# Patient Record
Sex: Female | Born: 1935
Health system: Southern US, Community
[De-identification: ages and names within clinical notes are randomized; demographics above are authoritative.]

## PROBLEM LIST (undated history)

## (undated) DIAGNOSIS — I10 Essential (primary) hypertension: Secondary | ICD-10-CM

## (undated) DIAGNOSIS — I499 Cardiac arrhythmia, unspecified: Secondary | ICD-10-CM

## (undated) DIAGNOSIS — Z9889 Other specified postprocedural states: Secondary | ICD-10-CM

## (undated) DIAGNOSIS — R112 Nausea with vomiting, unspecified: Secondary | ICD-10-CM

## (undated) HISTORY — PX: EYE SURGERY: SHX253

## (undated) HISTORY — PX: ABDOMINAL HYSTERECTOMY: SHX81

## (undated) HISTORY — PX: TONSILLECTOMY: SUR1361

---

## 2017-01-05 DIAGNOSIS — H401131 Primary open-angle glaucoma, bilateral, mild stage: Secondary | ICD-10-CM | POA: Diagnosis not present

## 2017-02-04 HISTORY — PX: SHOULDER SURGERY: SHX246

## 2017-02-05 DIAGNOSIS — T148XXA Other injury of unspecified body region, initial encounter: Secondary | ICD-10-CM | POA: Diagnosis not present

## 2017-02-05 DIAGNOSIS — S42292A Other displaced fracture of upper end of left humerus, initial encounter for closed fracture: Secondary | ICD-10-CM | POA: Diagnosis not present

## 2017-02-05 DIAGNOSIS — X58XXXA Exposure to other specified factors, initial encounter: Secondary | ICD-10-CM | POA: Diagnosis not present

## 2017-02-05 DIAGNOSIS — S42202A Unspecified fracture of upper end of left humerus, initial encounter for closed fracture: Secondary | ICD-10-CM | POA: Diagnosis not present

## 2017-02-05 DIAGNOSIS — W19XXXA Unspecified fall, initial encounter: Secondary | ICD-10-CM | POA: Diagnosis not present

## 2017-02-09 DIAGNOSIS — H401131 Primary open-angle glaucoma, bilateral, mild stage: Secondary | ICD-10-CM | POA: Diagnosis not present

## 2017-02-10 DIAGNOSIS — S42222A 2-part displaced fracture of surgical neck of left humerus, initial encounter for closed fracture: Secondary | ICD-10-CM | POA: Diagnosis not present

## 2017-02-11 DIAGNOSIS — S42222A 2-part displaced fracture of surgical neck of left humerus, initial encounter for closed fracture: Secondary | ICD-10-CM | POA: Diagnosis not present

## 2017-02-11 DIAGNOSIS — Z01812 Encounter for preprocedural laboratory examination: Secondary | ICD-10-CM | POA: Diagnosis not present

## 2017-02-12 DIAGNOSIS — Z01818 Encounter for other preprocedural examination: Secondary | ICD-10-CM | POA: Diagnosis not present

## 2017-02-12 DIAGNOSIS — S42222A 2-part displaced fracture of surgical neck of left humerus, initial encounter for closed fracture: Secondary | ICD-10-CM | POA: Diagnosis not present

## 2017-02-16 DIAGNOSIS — S42201D Unspecified fracture of upper end of right humerus, subsequent encounter for fracture with routine healing: Secondary | ICD-10-CM | POA: Diagnosis not present

## 2017-02-16 DIAGNOSIS — E785 Hyperlipidemia, unspecified: Secondary | ICD-10-CM | POA: Diagnosis present

## 2017-02-16 DIAGNOSIS — Z4789 Encounter for other orthopedic aftercare: Secondary | ICD-10-CM | POA: Diagnosis not present

## 2017-02-16 DIAGNOSIS — S42292D Other displaced fracture of upper end of left humerus, subsequent encounter for fracture with routine healing: Secondary | ICD-10-CM | POA: Diagnosis not present

## 2017-02-16 DIAGNOSIS — I10 Essential (primary) hypertension: Secondary | ICD-10-CM | POA: Diagnosis not present

## 2017-02-16 DIAGNOSIS — R278 Other lack of coordination: Secondary | ICD-10-CM | POA: Diagnosis not present

## 2017-02-16 DIAGNOSIS — R42 Dizziness and giddiness: Secondary | ICD-10-CM | POA: Diagnosis present

## 2017-02-16 DIAGNOSIS — M6281 Muscle weakness (generalized): Secondary | ICD-10-CM | POA: Diagnosis not present

## 2017-02-16 DIAGNOSIS — K5900 Constipation, unspecified: Secondary | ICD-10-CM | POA: Diagnosis not present

## 2017-02-16 DIAGNOSIS — Z4889 Encounter for other specified surgical aftercare: Secondary | ICD-10-CM | POA: Diagnosis not present

## 2017-02-16 DIAGNOSIS — I129 Hypertensive chronic kidney disease with stage 1 through stage 4 chronic kidney disease, or unspecified chronic kidney disease: Secondary | ICD-10-CM | POA: Diagnosis present

## 2017-02-16 DIAGNOSIS — E039 Hypothyroidism, unspecified: Secondary | ICD-10-CM | POA: Diagnosis present

## 2017-02-16 DIAGNOSIS — N183 Chronic kidney disease, stage 3 (moderate): Secondary | ICD-10-CM | POA: Diagnosis present

## 2017-02-16 DIAGNOSIS — S42222A 2-part displaced fracture of surgical neck of left humerus, initial encounter for closed fracture: Secondary | ICD-10-CM | POA: Diagnosis not present

## 2017-02-16 DIAGNOSIS — M25512 Pain in left shoulder: Secondary | ICD-10-CM | POA: Diagnosis not present

## 2017-02-16 DIAGNOSIS — S42292A Other displaced fracture of upper end of left humerus, initial encounter for closed fracture: Secondary | ICD-10-CM | POA: Diagnosis present

## 2017-02-16 DIAGNOSIS — H409 Unspecified glaucoma: Secondary | ICD-10-CM | POA: Diagnosis not present

## 2017-02-16 DIAGNOSIS — R262 Difficulty in walking, not elsewhere classified: Secondary | ICD-10-CM | POA: Diagnosis not present

## 2017-02-16 DIAGNOSIS — S42202D Unspecified fracture of upper end of left humerus, subsequent encounter for fracture with routine healing: Secondary | ICD-10-CM | POA: Diagnosis not present

## 2017-02-16 DIAGNOSIS — R2681 Unsteadiness on feet: Secondary | ICD-10-CM | POA: Diagnosis not present

## 2017-02-19 DIAGNOSIS — H409 Unspecified glaucoma: Secondary | ICD-10-CM | POA: Diagnosis not present

## 2017-02-19 DIAGNOSIS — E039 Hypothyroidism, unspecified: Secondary | ICD-10-CM | POA: Diagnosis not present

## 2017-02-19 DIAGNOSIS — S42292D Other displaced fracture of upper end of left humerus, subsequent encounter for fracture with routine healing: Secondary | ICD-10-CM | POA: Diagnosis not present

## 2017-02-19 DIAGNOSIS — R262 Difficulty in walking, not elsewhere classified: Secondary | ICD-10-CM | POA: Diagnosis not present

## 2017-02-19 DIAGNOSIS — Z4889 Encounter for other specified surgical aftercare: Secondary | ICD-10-CM | POA: Diagnosis not present

## 2017-02-19 DIAGNOSIS — M6281 Muscle weakness (generalized): Secondary | ICD-10-CM | POA: Diagnosis not present

## 2017-02-19 DIAGNOSIS — S42202D Unspecified fracture of upper end of left humerus, subsequent encounter for fracture with routine healing: Secondary | ICD-10-CM | POA: Diagnosis not present

## 2017-02-19 DIAGNOSIS — S42201D Unspecified fracture of upper end of right humerus, subsequent encounter for fracture with routine healing: Secondary | ICD-10-CM | POA: Diagnosis not present

## 2017-02-19 DIAGNOSIS — R2681 Unsteadiness on feet: Secondary | ICD-10-CM | POA: Diagnosis not present

## 2017-02-19 DIAGNOSIS — R278 Other lack of coordination: Secondary | ICD-10-CM | POA: Diagnosis not present

## 2017-02-19 DIAGNOSIS — Z4789 Encounter for other orthopedic aftercare: Secondary | ICD-10-CM | POA: Diagnosis not present

## 2017-02-19 DIAGNOSIS — I10 Essential (primary) hypertension: Secondary | ICD-10-CM | POA: Diagnosis not present

## 2017-03-03 DIAGNOSIS — S42292D Other displaced fracture of upper end of left humerus, subsequent encounter for fracture with routine healing: Secondary | ICD-10-CM | POA: Diagnosis not present

## 2017-03-03 DIAGNOSIS — H409 Unspecified glaucoma: Secondary | ICD-10-CM | POA: Diagnosis not present

## 2017-03-03 DIAGNOSIS — I10 Essential (primary) hypertension: Secondary | ICD-10-CM | POA: Diagnosis not present

## 2017-03-03 DIAGNOSIS — E039 Hypothyroidism, unspecified: Secondary | ICD-10-CM | POA: Diagnosis not present

## 2017-03-06 DIAGNOSIS — S42202D Unspecified fracture of upper end of left humerus, subsequent encounter for fracture with routine healing: Secondary | ICD-10-CM | POA: Diagnosis not present

## 2017-03-06 DIAGNOSIS — M5136 Other intervertebral disc degeneration, lumbar region: Secondary | ICD-10-CM | POA: Diagnosis not present

## 2017-03-06 DIAGNOSIS — Z7982 Long term (current) use of aspirin: Secondary | ICD-10-CM | POA: Diagnosis not present

## 2017-03-06 DIAGNOSIS — K5900 Constipation, unspecified: Secondary | ICD-10-CM | POA: Diagnosis not present

## 2017-03-06 DIAGNOSIS — Z9181 History of falling: Secondary | ICD-10-CM | POA: Diagnosis not present

## 2017-03-06 DIAGNOSIS — I10 Essential (primary) hypertension: Secondary | ICD-10-CM | POA: Diagnosis not present

## 2017-03-06 DIAGNOSIS — H409 Unspecified glaucoma: Secondary | ICD-10-CM | POA: Diagnosis not present

## 2017-03-06 DIAGNOSIS — E039 Hypothyroidism, unspecified: Secondary | ICD-10-CM | POA: Diagnosis not present

## 2017-03-06 DIAGNOSIS — E538 Deficiency of other specified B group vitamins: Secondary | ICD-10-CM | POA: Diagnosis not present

## 2017-03-10 DIAGNOSIS — S42202D Unspecified fracture of upper end of left humerus, subsequent encounter for fracture with routine healing: Secondary | ICD-10-CM | POA: Diagnosis not present

## 2017-03-10 DIAGNOSIS — K5900 Constipation, unspecified: Secondary | ICD-10-CM | POA: Diagnosis not present

## 2017-03-10 DIAGNOSIS — H409 Unspecified glaucoma: Secondary | ICD-10-CM | POA: Diagnosis not present

## 2017-03-10 DIAGNOSIS — I10 Essential (primary) hypertension: Secondary | ICD-10-CM | POA: Diagnosis not present

## 2017-03-10 DIAGNOSIS — E039 Hypothyroidism, unspecified: Secondary | ICD-10-CM | POA: Diagnosis not present

## 2017-03-10 DIAGNOSIS — M5136 Other intervertebral disc degeneration, lumbar region: Secondary | ICD-10-CM | POA: Diagnosis not present

## 2017-03-11 DIAGNOSIS — I10 Essential (primary) hypertension: Secondary | ICD-10-CM | POA: Diagnosis not present

## 2017-03-11 DIAGNOSIS — S42202D Unspecified fracture of upper end of left humerus, subsequent encounter for fracture with routine healing: Secondary | ICD-10-CM | POA: Diagnosis not present

## 2017-03-11 DIAGNOSIS — E039 Hypothyroidism, unspecified: Secondary | ICD-10-CM | POA: Diagnosis not present

## 2017-03-11 DIAGNOSIS — K5900 Constipation, unspecified: Secondary | ICD-10-CM | POA: Diagnosis not present

## 2017-03-11 DIAGNOSIS — H409 Unspecified glaucoma: Secondary | ICD-10-CM | POA: Diagnosis not present

## 2017-03-11 DIAGNOSIS — M5136 Other intervertebral disc degeneration, lumbar region: Secondary | ICD-10-CM | POA: Diagnosis not present

## 2017-03-15 DIAGNOSIS — M5136 Other intervertebral disc degeneration, lumbar region: Secondary | ICD-10-CM | POA: Diagnosis not present

## 2017-03-15 DIAGNOSIS — H409 Unspecified glaucoma: Secondary | ICD-10-CM | POA: Diagnosis not present

## 2017-03-15 DIAGNOSIS — E039 Hypothyroidism, unspecified: Secondary | ICD-10-CM | POA: Diagnosis not present

## 2017-03-15 DIAGNOSIS — I10 Essential (primary) hypertension: Secondary | ICD-10-CM | POA: Diagnosis not present

## 2017-03-15 DIAGNOSIS — S42202D Unspecified fracture of upper end of left humerus, subsequent encounter for fracture with routine healing: Secondary | ICD-10-CM | POA: Diagnosis not present

## 2017-03-15 DIAGNOSIS — K5900 Constipation, unspecified: Secondary | ICD-10-CM | POA: Diagnosis not present

## 2017-03-17 DIAGNOSIS — K5900 Constipation, unspecified: Secondary | ICD-10-CM | POA: Diagnosis not present

## 2017-03-17 DIAGNOSIS — E039 Hypothyroidism, unspecified: Secondary | ICD-10-CM | POA: Diagnosis not present

## 2017-03-17 DIAGNOSIS — S42202D Unspecified fracture of upper end of left humerus, subsequent encounter for fracture with routine healing: Secondary | ICD-10-CM | POA: Diagnosis not present

## 2017-03-17 DIAGNOSIS — I10 Essential (primary) hypertension: Secondary | ICD-10-CM | POA: Diagnosis not present

## 2017-03-17 DIAGNOSIS — H409 Unspecified glaucoma: Secondary | ICD-10-CM | POA: Diagnosis not present

## 2017-03-17 DIAGNOSIS — M5136 Other intervertebral disc degeneration, lumbar region: Secondary | ICD-10-CM | POA: Diagnosis not present

## 2017-03-18 DIAGNOSIS — H409 Unspecified glaucoma: Secondary | ICD-10-CM | POA: Diagnosis not present

## 2017-03-18 DIAGNOSIS — K5900 Constipation, unspecified: Secondary | ICD-10-CM | POA: Diagnosis not present

## 2017-03-18 DIAGNOSIS — I10 Essential (primary) hypertension: Secondary | ICD-10-CM | POA: Diagnosis not present

## 2017-03-18 DIAGNOSIS — M5136 Other intervertebral disc degeneration, lumbar region: Secondary | ICD-10-CM | POA: Diagnosis not present

## 2017-03-18 DIAGNOSIS — S42202D Unspecified fracture of upper end of left humerus, subsequent encounter for fracture with routine healing: Secondary | ICD-10-CM | POA: Diagnosis not present

## 2017-03-18 DIAGNOSIS — E039 Hypothyroidism, unspecified: Secondary | ICD-10-CM | POA: Diagnosis not present

## 2017-03-20 DIAGNOSIS — M5136 Other intervertebral disc degeneration, lumbar region: Secondary | ICD-10-CM | POA: Diagnosis not present

## 2017-03-20 DIAGNOSIS — K5900 Constipation, unspecified: Secondary | ICD-10-CM | POA: Diagnosis not present

## 2017-03-20 DIAGNOSIS — H409 Unspecified glaucoma: Secondary | ICD-10-CM | POA: Diagnosis not present

## 2017-03-20 DIAGNOSIS — S42202D Unspecified fracture of upper end of left humerus, subsequent encounter for fracture with routine healing: Secondary | ICD-10-CM | POA: Diagnosis not present

## 2017-03-20 DIAGNOSIS — I10 Essential (primary) hypertension: Secondary | ICD-10-CM | POA: Diagnosis not present

## 2017-03-20 DIAGNOSIS — E039 Hypothyroidism, unspecified: Secondary | ICD-10-CM | POA: Diagnosis not present

## 2017-03-22 DIAGNOSIS — K5900 Constipation, unspecified: Secondary | ICD-10-CM | POA: Diagnosis not present

## 2017-03-22 DIAGNOSIS — H409 Unspecified glaucoma: Secondary | ICD-10-CM | POA: Diagnosis not present

## 2017-03-22 DIAGNOSIS — I10 Essential (primary) hypertension: Secondary | ICD-10-CM | POA: Diagnosis not present

## 2017-03-22 DIAGNOSIS — S42202D Unspecified fracture of upper end of left humerus, subsequent encounter for fracture with routine healing: Secondary | ICD-10-CM | POA: Diagnosis not present

## 2017-03-22 DIAGNOSIS — E039 Hypothyroidism, unspecified: Secondary | ICD-10-CM | POA: Diagnosis not present

## 2017-03-22 DIAGNOSIS — M5136 Other intervertebral disc degeneration, lumbar region: Secondary | ICD-10-CM | POA: Diagnosis not present

## 2017-03-23 DIAGNOSIS — I10 Essential (primary) hypertension: Secondary | ICD-10-CM | POA: Diagnosis not present

## 2017-03-23 DIAGNOSIS — S42202D Unspecified fracture of upper end of left humerus, subsequent encounter for fracture with routine healing: Secondary | ICD-10-CM | POA: Diagnosis not present

## 2017-03-23 DIAGNOSIS — H409 Unspecified glaucoma: Secondary | ICD-10-CM | POA: Diagnosis not present

## 2017-03-23 DIAGNOSIS — E039 Hypothyroidism, unspecified: Secondary | ICD-10-CM | POA: Diagnosis not present

## 2017-03-23 DIAGNOSIS — M5136 Other intervertebral disc degeneration, lumbar region: Secondary | ICD-10-CM | POA: Diagnosis not present

## 2017-03-23 DIAGNOSIS — K5900 Constipation, unspecified: Secondary | ICD-10-CM | POA: Diagnosis not present

## 2017-03-24 DIAGNOSIS — H409 Unspecified glaucoma: Secondary | ICD-10-CM | POA: Diagnosis not present

## 2017-03-24 DIAGNOSIS — S42222D 2-part displaced fracture of surgical neck of left humerus, subsequent encounter for fracture with routine healing: Secondary | ICD-10-CM | POA: Diagnosis not present

## 2017-03-24 DIAGNOSIS — M5136 Other intervertebral disc degeneration, lumbar region: Secondary | ICD-10-CM | POA: Diagnosis not present

## 2017-03-24 DIAGNOSIS — I10 Essential (primary) hypertension: Secondary | ICD-10-CM | POA: Diagnosis not present

## 2017-03-24 DIAGNOSIS — S42202D Unspecified fracture of upper end of left humerus, subsequent encounter for fracture with routine healing: Secondary | ICD-10-CM | POA: Diagnosis not present

## 2017-03-24 DIAGNOSIS — E039 Hypothyroidism, unspecified: Secondary | ICD-10-CM | POA: Diagnosis not present

## 2017-03-24 DIAGNOSIS — K5900 Constipation, unspecified: Secondary | ICD-10-CM | POA: Diagnosis not present

## 2017-03-29 DIAGNOSIS — S42222A 2-part displaced fracture of surgical neck of left humerus, initial encounter for closed fracture: Secondary | ICD-10-CM | POA: Diagnosis not present

## 2017-04-01 DIAGNOSIS — S42222A 2-part displaced fracture of surgical neck of left humerus, initial encounter for closed fracture: Secondary | ICD-10-CM | POA: Diagnosis not present

## 2017-04-05 DIAGNOSIS — S42222A 2-part displaced fracture of surgical neck of left humerus, initial encounter for closed fracture: Secondary | ICD-10-CM | POA: Diagnosis not present

## 2017-04-08 DIAGNOSIS — M79622 Pain in left upper arm: Secondary | ICD-10-CM | POA: Diagnosis not present

## 2017-04-13 DIAGNOSIS — M79622 Pain in left upper arm: Secondary | ICD-10-CM | POA: Diagnosis not present

## 2017-04-14 DIAGNOSIS — I10 Essential (primary) hypertension: Secondary | ICD-10-CM | POA: Diagnosis not present

## 2017-04-14 DIAGNOSIS — R42 Dizziness and giddiness: Secondary | ICD-10-CM | POA: Diagnosis not present

## 2017-04-15 DIAGNOSIS — M79622 Pain in left upper arm: Secondary | ICD-10-CM | POA: Diagnosis not present

## 2017-04-19 DIAGNOSIS — M79622 Pain in left upper arm: Secondary | ICD-10-CM | POA: Diagnosis not present

## 2017-04-22 DIAGNOSIS — M79622 Pain in left upper arm: Secondary | ICD-10-CM | POA: Diagnosis not present

## 2017-04-26 DIAGNOSIS — M79622 Pain in left upper arm: Secondary | ICD-10-CM | POA: Diagnosis not present

## 2017-04-29 DIAGNOSIS — M79622 Pain in left upper arm: Secondary | ICD-10-CM | POA: Diagnosis not present

## 2017-05-04 DIAGNOSIS — M79622 Pain in left upper arm: Secondary | ICD-10-CM | POA: Diagnosis not present

## 2017-05-05 DIAGNOSIS — S42222A 2-part displaced fracture of surgical neck of left humerus, initial encounter for closed fracture: Secondary | ICD-10-CM | POA: Diagnosis not present

## 2017-05-07 DIAGNOSIS — M79622 Pain in left upper arm: Secondary | ICD-10-CM | POA: Diagnosis not present

## 2017-05-10 DIAGNOSIS — M79622 Pain in left upper arm: Secondary | ICD-10-CM | POA: Diagnosis not present

## 2017-05-11 DIAGNOSIS — H401131 Primary open-angle glaucoma, bilateral, mild stage: Secondary | ICD-10-CM | POA: Diagnosis not present

## 2017-05-13 DIAGNOSIS — M79622 Pain in left upper arm: Secondary | ICD-10-CM | POA: Diagnosis not present

## 2017-05-14 DIAGNOSIS — Z1231 Encounter for screening mammogram for malignant neoplasm of breast: Secondary | ICD-10-CM | POA: Diagnosis not present

## 2017-05-17 DIAGNOSIS — M79622 Pain in left upper arm: Secondary | ICD-10-CM | POA: Diagnosis not present

## 2017-05-18 DIAGNOSIS — B351 Tinea unguium: Secondary | ICD-10-CM | POA: Diagnosis not present

## 2017-05-18 DIAGNOSIS — L821 Other seborrheic keratosis: Secondary | ICD-10-CM | POA: Diagnosis not present

## 2017-05-18 DIAGNOSIS — L728 Other follicular cysts of the skin and subcutaneous tissue: Secondary | ICD-10-CM | POA: Diagnosis not present

## 2017-05-18 DIAGNOSIS — Z08 Encounter for follow-up examination after completed treatment for malignant neoplasm: Secondary | ICD-10-CM | POA: Diagnosis not present

## 2017-05-18 DIAGNOSIS — L219 Seborrheic dermatitis, unspecified: Secondary | ICD-10-CM | POA: Diagnosis not present

## 2017-05-18 DIAGNOSIS — Z85828 Personal history of other malignant neoplasm of skin: Secondary | ICD-10-CM | POA: Diagnosis not present

## 2017-05-20 DIAGNOSIS — M79622 Pain in left upper arm: Secondary | ICD-10-CM | POA: Diagnosis not present

## 2017-05-24 DIAGNOSIS — M79622 Pain in left upper arm: Secondary | ICD-10-CM | POA: Diagnosis not present

## 2017-05-27 DIAGNOSIS — M79622 Pain in left upper arm: Secondary | ICD-10-CM | POA: Diagnosis not present

## 2017-05-31 DIAGNOSIS — M79622 Pain in left upper arm: Secondary | ICD-10-CM | POA: Diagnosis not present

## 2017-06-03 DIAGNOSIS — M79622 Pain in left upper arm: Secondary | ICD-10-CM | POA: Diagnosis not present

## 2017-06-08 DIAGNOSIS — M79622 Pain in left upper arm: Secondary | ICD-10-CM | POA: Diagnosis not present

## 2017-06-11 DIAGNOSIS — M79622 Pain in left upper arm: Secondary | ICD-10-CM | POA: Diagnosis not present

## 2017-06-14 DIAGNOSIS — M79622 Pain in left upper arm: Secondary | ICD-10-CM | POA: Diagnosis not present

## 2017-06-16 DIAGNOSIS — S42222D 2-part displaced fracture of surgical neck of left humerus, subsequent encounter for fracture with routine healing: Secondary | ICD-10-CM | POA: Diagnosis not present

## 2017-06-16 DIAGNOSIS — B351 Tinea unguium: Secondary | ICD-10-CM | POA: Diagnosis not present

## 2017-06-16 DIAGNOSIS — M810 Age-related osteoporosis without current pathological fracture: Secondary | ICD-10-CM | POA: Diagnosis not present

## 2017-06-17 DIAGNOSIS — M79622 Pain in left upper arm: Secondary | ICD-10-CM | POA: Diagnosis not present

## 2017-06-23 DIAGNOSIS — L600 Ingrowing nail: Secondary | ICD-10-CM | POA: Diagnosis not present

## 2017-07-08 DIAGNOSIS — M899 Disorder of bone, unspecified: Secondary | ICD-10-CM | POA: Diagnosis not present

## 2017-07-19 DIAGNOSIS — S42222D 2-part displaced fracture of surgical neck of left humerus, subsequent encounter for fracture with routine healing: Secondary | ICD-10-CM | POA: Diagnosis not present

## 2017-09-23 DIAGNOSIS — E785 Hyperlipidemia, unspecified: Secondary | ICD-10-CM | POA: Diagnosis not present

## 2017-09-23 DIAGNOSIS — I10 Essential (primary) hypertension: Secondary | ICD-10-CM | POA: Diagnosis not present

## 2017-09-23 DIAGNOSIS — E039 Hypothyroidism, unspecified: Secondary | ICD-10-CM | POA: Diagnosis not present

## 2017-09-30 DIAGNOSIS — I10 Essential (primary) hypertension: Secondary | ICD-10-CM | POA: Diagnosis not present

## 2017-09-30 DIAGNOSIS — Z0000 Encounter for general adult medical examination without abnormal findings: Secondary | ICD-10-CM | POA: Diagnosis not present

## 2017-09-30 DIAGNOSIS — Z23 Encounter for immunization: Secondary | ICD-10-CM | POA: Diagnosis not present

## 2017-09-30 DIAGNOSIS — E039 Hypothyroidism, unspecified: Secondary | ICD-10-CM | POA: Diagnosis not present

## 2017-09-30 DIAGNOSIS — E785 Hyperlipidemia, unspecified: Secondary | ICD-10-CM | POA: Diagnosis not present

## 2017-10-12 DIAGNOSIS — H40053 Ocular hypertension, bilateral: Secondary | ICD-10-CM | POA: Diagnosis not present

## 2017-10-12 DIAGNOSIS — H401131 Primary open-angle glaucoma, bilateral, mild stage: Secondary | ICD-10-CM | POA: Diagnosis not present

## 2017-12-09 DIAGNOSIS — Z79899 Other long term (current) drug therapy: Secondary | ICD-10-CM | POA: Diagnosis not present

## 2017-12-09 DIAGNOSIS — I10 Essential (primary) hypertension: Secondary | ICD-10-CM | POA: Diagnosis not present

## 2018-01-12 DIAGNOSIS — H40053 Ocular hypertension, bilateral: Secondary | ICD-10-CM | POA: Diagnosis not present

## 2018-01-12 DIAGNOSIS — Z961 Presence of intraocular lens: Secondary | ICD-10-CM | POA: Diagnosis not present

## 2018-04-20 DIAGNOSIS — H401132 Primary open-angle glaucoma, bilateral, moderate stage: Secondary | ICD-10-CM | POA: Diagnosis not present

## 2018-04-20 DIAGNOSIS — Z961 Presence of intraocular lens: Secondary | ICD-10-CM | POA: Diagnosis not present

## 2018-05-13 DIAGNOSIS — D2271 Melanocytic nevi of right lower limb, including hip: Secondary | ICD-10-CM | POA: Diagnosis not present

## 2018-05-13 DIAGNOSIS — L720 Epidermal cyst: Secondary | ICD-10-CM | POA: Diagnosis not present

## 2018-05-13 DIAGNOSIS — Z85828 Personal history of other malignant neoplasm of skin: Secondary | ICD-10-CM | POA: Diagnosis not present

## 2018-05-13 DIAGNOSIS — Z08 Encounter for follow-up examination after completed treatment for malignant neoplasm: Secondary | ICD-10-CM | POA: Diagnosis not present

## 2018-05-13 DIAGNOSIS — D2261 Melanocytic nevi of right upper limb, including shoulder: Secondary | ICD-10-CM | POA: Diagnosis not present

## 2018-05-13 DIAGNOSIS — L570 Actinic keratosis: Secondary | ICD-10-CM | POA: Diagnosis not present

## 2018-05-17 DIAGNOSIS — Z1231 Encounter for screening mammogram for malignant neoplasm of breast: Secondary | ICD-10-CM | POA: Diagnosis not present

## 2018-05-18 DIAGNOSIS — F5109 Other insomnia not due to a substance or known physiological condition: Secondary | ICD-10-CM | POA: Diagnosis not present

## 2018-05-18 DIAGNOSIS — I10 Essential (primary) hypertension: Secondary | ICD-10-CM | POA: Diagnosis not present

## 2018-05-18 DIAGNOSIS — Z0289 Encounter for other administrative examinations: Secondary | ICD-10-CM | POA: Diagnosis not present

## 2018-05-19 DIAGNOSIS — H401131 Primary open-angle glaucoma, bilateral, mild stage: Secondary | ICD-10-CM | POA: Diagnosis not present

## 2018-05-19 DIAGNOSIS — Z961 Presence of intraocular lens: Secondary | ICD-10-CM | POA: Diagnosis not present

## 2018-07-22 DIAGNOSIS — H43812 Vitreous degeneration, left eye: Secondary | ICD-10-CM | POA: Diagnosis not present

## 2018-07-22 DIAGNOSIS — H401131 Primary open-angle glaucoma, bilateral, mild stage: Secondary | ICD-10-CM | POA: Diagnosis not present

## 2018-07-22 DIAGNOSIS — Z961 Presence of intraocular lens: Secondary | ICD-10-CM | POA: Diagnosis not present

## 2018-08-31 DIAGNOSIS — S42212K Unspecified displaced fracture of surgical neck of left humerus, subsequent encounter for fracture with nonunion: Secondary | ICD-10-CM | POA: Diagnosis not present

## 2018-08-31 DIAGNOSIS — M25512 Pain in left shoulder: Secondary | ICD-10-CM | POA: Diagnosis not present

## 2018-09-02 DIAGNOSIS — I8390 Asymptomatic varicose veins of unspecified lower extremity: Secondary | ICD-10-CM | POA: Diagnosis not present

## 2018-09-02 DIAGNOSIS — N183 Chronic kidney disease, stage 3 (moderate): Secondary | ICD-10-CM | POA: Diagnosis not present

## 2018-09-02 DIAGNOSIS — M96622 Fracture of humerus following insertion of orthopedic implant, joint prosthesis, or bone plate, left arm: Secondary | ICD-10-CM | POA: Diagnosis not present

## 2018-09-02 DIAGNOSIS — R82998 Other abnormal findings in urine: Secondary | ICD-10-CM | POA: Diagnosis not present

## 2018-09-02 DIAGNOSIS — Z6824 Body mass index (BMI) 24.0-24.9, adult: Secondary | ICD-10-CM | POA: Diagnosis not present

## 2018-09-02 DIAGNOSIS — I10 Essential (primary) hypertension: Secondary | ICD-10-CM | POA: Diagnosis not present

## 2018-09-02 DIAGNOSIS — Z23 Encounter for immunization: Secondary | ICD-10-CM | POA: Diagnosis not present

## 2018-09-02 DIAGNOSIS — H4089 Other specified glaucoma: Secondary | ICD-10-CM | POA: Diagnosis not present

## 2018-09-02 DIAGNOSIS — R808 Other proteinuria: Secondary | ICD-10-CM | POA: Diagnosis not present

## 2018-09-02 DIAGNOSIS — Z01818 Encounter for other preprocedural examination: Secondary | ICD-10-CM | POA: Diagnosis not present

## 2018-09-02 DIAGNOSIS — H2589 Other age-related cataract: Secondary | ICD-10-CM | POA: Diagnosis not present

## 2018-09-02 DIAGNOSIS — M859 Disorder of bone density and structure, unspecified: Secondary | ICD-10-CM | POA: Diagnosis not present

## 2018-09-02 DIAGNOSIS — E038 Other specified hypothyroidism: Secondary | ICD-10-CM | POA: Diagnosis not present

## 2018-09-21 NOTE — Progress Notes (Signed)
Left message with Dr. Susie Cassette scheduler, no orders in epic and surgery time is 0730 on 09/29/18, although there is a note to move the time to 1030.

## 2018-09-21 NOTE — Pre-Procedure Instructions (Signed)
Valerie Molina  09/21/2018      [id number] - [place], [place] [address] [place] [address] Phone: [phone] Fax: [phone]    Your procedure is scheduled on Oct. 24  Report to [facility] Admitting at 5:30  A.M.  Call this number if you have problems the morning of surgery:  [phone]   Remember:   Do not eat or drink after midnight.      Take these medicines the morning of surgery with A SIP OF WATER :              Amlodipine (norvasc)             Eye drops             Levothyroxine (synthroid)                7 days prior to surgery STOP taking any Aspirin(unless otherwise instructed by your surgeon), Aleve, Naproxen, Ibuprofen, Motrin, Advil, Goody's, BC's, all herbal medications, fish oil, and all vitamins    Do not wear jewelry, make-up or nail polish.  Do not wear lotions, powders, or perfumes, or deodorant.  Do not shave 48 hours prior to surgery.  Men may shave face and neck.  Do not bring valuables to the hospital.  [facility] is not responsible for any belongings or valuables.  Contacts, dentures or bridgework may not be worn into surgery.  Leave your suitcase in the car.  After surgery it may be brought to your room.  For patients admitted to the hospital, discharge time will be determined by your treatment team.  Patients discharged the day of surgery will not be allowed to drive home.    Special instructions:  Valerie Molina- Preparing For Surgery  Before surgery, you can play an important role. Because skin is not sterile, your skin needs to be as free of germs as possible. You can reduce the number of germs on your skin by washing with CHG (chlorahexidine gluconate) Soap before surgery.  CHG is an antiseptic cleaner which kills germs and bonds with the skin to continue killing germs even after washing.    Oral Hygiene is also important to reduce your  risk of infection.  Remember - BRUSH YOUR TEETH THE MORNING OF SURGERY WITH YOUR REGULAR TOOTHPASTE  Please do not use if you have an allergy to CHG or antibacterial soaps. If your skin becomes reddened/irritated stop using the CHG.  Do not shave (including legs and underarms) for at least 48 hours prior to first CHG shower. It is OK to shave your face.  Please follow these instructions carefully.   1. Shower the NIGHT BEFORE SURGERY and the MORNING OF SURGERY with CHG.   2. If you chose to wash your hair, wash your hair first as usual with your normal shampoo.  3. After you shampoo, rinse your hair and body thoroughly to remove the shampoo.  4. Use CHG as you would any other liquid soap. You can apply CHG directly to the skin and wash gently with a scrungie or a clean washcloth.   5. Apply the CHG Soap to your body ONLY FROM THE NECK DOWN.  Do not use on open wounds or open sores. Avoid contact with your eyes, ears, mouth and genitals (private parts). Wash Face and genitals (private parts)  with your normal soap.  6. Wash thoroughly, paying special attention to the  area where your surgery will be performed.  7. Thoroughly rinse your body with warm water from the neck down.  8. DO NOT shower/wash with your normal soap after using and rinsing off the CHG Soap.  9. Pat yourself dry with a CLEAN TOWEL.  10. Wear CLEAN PAJAMAS to bed the night before surgery, wear comfortable clothes the morning of surgery  11. Place CLEAN SHEETS on your bed the night of your first shower and DO NOT SLEEP WITH PETS.    Day of Surgery:  Do not apply any deodorants/lotions.  Please wear clean clothes to the hospital/surgery center.   Remember to brush your teeth WITH YOUR REGULAR TOOTHPASTE.    Please read over the following fact sheets that you were given. Coughing and Deep Breathing, MRSA Information and Surgical Site Infection Prevention

## 2018-09-22 ENCOUNTER — Encounter (HOSPITAL_COMMUNITY)
Admission: RE | Admit: 2018-09-22 | Discharge: 2018-09-22 | Disposition: A | Discharge status: Home / Self Care | Payer: Medicare Other | Source: Ambulatory Visit | Attending: Orthopedic Surgery | Admitting: Orthopedic Surgery

## 2018-09-22 ENCOUNTER — Other Ambulatory Visit: Payer: Self-pay

## 2018-09-22 ENCOUNTER — Encounter (HOSPITAL_COMMUNITY): Payer: Self-pay

## 2018-09-22 DIAGNOSIS — Z01812 Encounter for preprocedural laboratory examination: Secondary | ICD-10-CM | POA: Insufficient documentation

## 2018-09-22 HISTORY — DX: Essential (primary) hypertension: I10

## 2018-09-22 HISTORY — DX: Other specified postprocedural states: R11.2

## 2018-09-22 HISTORY — DX: Cardiac arrhythmia, unspecified: I49.9

## 2018-09-22 HISTORY — DX: Other specified postprocedural states: Z98.890

## 2018-09-22 LAB — CBC
HEMATOCRIT: 40.7 % (ref 36.0–46.0)
Hemoglobin: 13.1 g/dL (ref 12.0–15.0)
MCH: 32 pg (ref 26.0–34.0)
MCHC: 32.2 g/dL (ref 30.0–36.0)
MCV: 99.5 fL (ref 80.0–100.0)
PLATELETS: 213 10*3/uL (ref 150–400)
RBC: 4.09 MIL/uL (ref 3.87–5.11)
RDW: 11.7 % (ref 11.5–15.5)
WBC: 8.4 10*3/uL (ref 4.0–10.5)
nRBC: 0 % (ref 0.0–0.2)

## 2018-09-22 LAB — BASIC METABOLIC PANEL
ANION GAP: 11 (ref 5–15)
BUN: 26 mg/dL — ABNORMAL HIGH (ref 8–23)
CALCIUM: 9.6 mg/dL (ref 8.9–10.3)
CHLORIDE: 106 mmol/L (ref 98–111)
CO2: 24 mmol/L (ref 22–32)
CREATININE: 1.29 mg/dL — AB (ref 0.44–1.00)
GFR calc Af Amer: 41 mL/min — ABNORMAL LOW (ref >60)
GFR calc non Af Amer: 36 mL/min — ABNORMAL LOW (ref >60)
Glucose, Bld: 100 mg/dL — ABNORMAL HIGH (ref 70–99)
Potassium: 4.7 mmol/L (ref 3.5–5.1)
SODIUM: 141 mmol/L (ref 135–145)

## 2018-09-22 LAB — SURGICAL PCR SCREEN
MRSA, PCR: NEGATIVE
STAPHYLOCOCCUS AUREUS: POSITIVE — AB

## 2018-09-22 NOTE — Progress Notes (Signed)
PCP - Dr. Osborne Casco Cardiologist - Patient denies  Chest x-ray - n/a EKG - 09/2018; tracing requested Stress Test - patient denies ECHO - patient denies Cardiac Cath - patient denies  Sleep Study - patient denies  Anesthesia review: n/a Patient denies shortness of breath, fever, cough and chest pain at PAT appointment   Patient verbalized understanding of instructions that were given to them at the PAT appointment. Patient was also instructed that they will need to review over the PAT instructions again at home before surgery.

## 2018-09-28 MED ORDER — TRANEXAMIC ACID-NACL 1000-0.7 MG/100ML-% IV SOLN
1000.0000 mg | INTRAVENOUS | Status: AC
  Administered 2018-09-29: 1000 mg via INTRAVENOUS
  Filled 2018-09-28: qty 100

## 2018-09-28 NOTE — Anesthesia Preprocedure Evaluation (Addendum)
Anesthesia Evaluation  Patient identified by MRN, date of birth, ID band Patient awake    Reviewed: Allergy & Precautions, NPO status , Patient's Chart, lab work & pertinent test results  History of Anesthesia Complications (+) PONVNegative for: history of anesthetic complications  Airway Mallampati: II       Dental  (+) Teeth Intact, Chipped,    Pulmonary neg pulmonary ROS,    breath sounds clear to auscultation       Cardiovascular hypertension, Pt. on medications negative cardio ROS   Rhythm:Regular Rate:Normal     Neuro/Psych negative neurological ROS  negative psych ROS   GI/Hepatic negative GI ROS, Neg liver ROS,   Endo/Other  Hypothyroidism   Renal/GU Renal InsufficiencyRenal disease  negative genitourinary   Musculoskeletal negative musculoskeletal ROS (+)   Abdominal   Peds  Hematology negative hematology ROS (+)   Anesthesia Other Findings Dysrhythmia is listed on patient chart but patient denies any history of this. Reviewed EKG which shows normal sinus rhythm.   Reproductive/Obstetrics                           Anesthesia Physical Anesthesia Plan  ASA: III  Anesthesia Plan: General   Post-op Pain Management: GA combined w/ Regional for post-op pain   Induction: Intravenous  PONV Risk Score and Plan: 4 or greater and Ondansetron, Dexamethasone and Treatment may vary due to age or medical condition  Airway Management Planned: Oral ETT  Additional Equipment: None  Intra-op Plan:   Post-operative Plan: Extubation in OR  Informed Consent: I have reviewed the patients History and Physical, chart, labs and discussed the procedure including the risks, benefits and alternatives for the proposed anesthesia with the patient or authorized representative who has indicated his/her understanding and acceptance.     Plan Discussed with:   Anesthesia Plan Comments:         Anesthesia Quick Evaluation

## 2018-09-29 ENCOUNTER — Inpatient Hospital Stay (HOSPITAL_COMMUNITY): Payer: Medicare Other | Admitting: Anesthesiology

## 2018-09-29 ENCOUNTER — Inpatient Hospital Stay (HOSPITAL_COMMUNITY): Payer: Medicare Other | Admitting: Physician Assistant

## 2018-09-29 ENCOUNTER — Encounter (HOSPITAL_COMMUNITY)
Admission: RE | Disposition: A | Discharge status: Skilled Nursing Facility | Payer: Self-pay | Source: Home / Self Care | Attending: Orthopedic Surgery

## 2018-09-29 ENCOUNTER — Encounter (HOSPITAL_COMMUNITY): Payer: Self-pay | Admitting: Surgery

## 2018-09-29 ENCOUNTER — Other Ambulatory Visit: Payer: Self-pay

## 2018-09-29 ENCOUNTER — Inpatient Hospital Stay (HOSPITAL_COMMUNITY)
Admission: RE | Admit: 2018-09-29 | Discharge: 2018-10-02 | DRG: 483 | Disposition: A | Discharge status: Skilled Nursing Facility | Payer: Medicare Other | Attending: Orthopedic Surgery | Admitting: Orthopedic Surgery

## 2018-09-29 DIAGNOSIS — Y838 Other surgical procedures as the cause of abnormal reaction of the patient, or of later complication, without mention of misadventure at the time of the procedure: Secondary | ICD-10-CM | POA: Diagnosis present

## 2018-09-29 DIAGNOSIS — H409 Unspecified glaucoma: Secondary | ICD-10-CM | POA: Diagnosis not present

## 2018-09-29 DIAGNOSIS — T84121A Displacement of internal fixation device of left humerus, initial encounter: Secondary | ICD-10-CM | POA: Diagnosis present

## 2018-09-29 DIAGNOSIS — M6281 Muscle weakness (generalized): Secondary | ICD-10-CM | POA: Diagnosis not present

## 2018-09-29 DIAGNOSIS — Z96619 Presence of unspecified artificial shoulder joint: Secondary | ICD-10-CM

## 2018-09-29 DIAGNOSIS — I10 Essential (primary) hypertension: Secondary | ICD-10-CM | POA: Diagnosis present

## 2018-09-29 DIAGNOSIS — G8918 Other acute postprocedural pain: Secondary | ICD-10-CM | POA: Diagnosis not present

## 2018-09-29 DIAGNOSIS — S42202K Unspecified fracture of upper end of left humerus, subsequent encounter for fracture with nonunion: Principal | ICD-10-CM

## 2018-09-29 DIAGNOSIS — M25512 Pain in left shoulder: Secondary | ICD-10-CM | POA: Diagnosis not present

## 2018-09-29 DIAGNOSIS — R2681 Unsteadiness on feet: Secondary | ICD-10-CM | POA: Diagnosis not present

## 2018-09-29 DIAGNOSIS — E039 Hypothyroidism, unspecified: Secondary | ICD-10-CM | POA: Diagnosis present

## 2018-09-29 DIAGNOSIS — S42212K Unspecified displaced fracture of surgical neck of left humerus, subsequent encounter for fracture with nonunion: Secondary | ICD-10-CM | POA: Diagnosis not present

## 2018-09-29 DIAGNOSIS — Z471 Aftercare following joint replacement surgery: Secondary | ICD-10-CM | POA: Diagnosis not present

## 2018-09-29 DIAGNOSIS — Z96612 Presence of left artificial shoulder joint: Secondary | ICD-10-CM | POA: Diagnosis not present

## 2018-09-29 DIAGNOSIS — R278 Other lack of coordination: Secondary | ICD-10-CM | POA: Diagnosis not present

## 2018-09-29 HISTORY — PX: REVERSE SHOULDER ARTHROPLASTY: SHX5054

## 2018-09-29 SURGERY — ARTHROPLASTY, SHOULDER, TOTAL, REVERSE
Anesthesia: General | Site: Shoulder | Laterality: Left

## 2018-09-29 MED ORDER — ONDANSETRON HCL 4 MG/2ML IJ SOLN
INTRAMUSCULAR | Status: DC | PRN
  Administered 2018-09-29: 4 mg via INTRAVENOUS

## 2018-09-29 MED ORDER — FENTANYL CITRATE (PF) 250 MCG/5ML IJ SOLN
INTRAMUSCULAR | Status: AC
  Filled 2018-09-29: qty 5

## 2018-09-29 MED ORDER — OXYCODONE HCL 5 MG PO TABS: 5.0000 mg | ORAL_TABLET | ORAL | Status: DC | PRN

## 2018-09-29 MED ORDER — 0.9 % SODIUM CHLORIDE (POUR BTL) OPTIME
TOPICAL | Status: DC | PRN
  Administered 2018-09-29: 1000 mL

## 2018-09-29 MED ORDER — DORZOLAMIDE HCL-TIMOLOL MAL 2-0.5 % OP SOLN
1.0000 [drp] | Freq: Two times a day (BID) | OPHTHALMIC | Status: DC
  Administered 2018-09-29 – 2018-10-02 (×6): 1 [drp] via OPHTHALMIC
  Filled 2018-09-29 (×2): qty 10

## 2018-09-29 MED ORDER — ESMOLOL HCL 100 MG/10ML IV SOLN
INTRAVENOUS | Status: DC | PRN
  Administered 2018-09-29: 20 mg via INTRAVENOUS

## 2018-09-29 MED ORDER — MAGNESIUM CITRATE PO SOLN: 1.0000 | Freq: Once | ORAL | Status: DC | PRN

## 2018-09-29 MED ORDER — PHENOL 1.4 % MT LIQD: 1.0000 | OROMUCOSAL | Status: DC | PRN

## 2018-09-29 MED ORDER — DEXAMETHASONE SODIUM PHOSPHATE 10 MG/ML IJ SOLN
INTRAMUSCULAR | Status: AC
  Filled 2018-09-29: qty 1

## 2018-09-29 MED ORDER — TRAMADOL HCL 50 MG PO TABS: 50.0000 mg | ORAL_TABLET | Freq: Four times a day (QID) | ORAL | Status: DC | PRN

## 2018-09-29 MED ORDER — DEXAMETHASONE SODIUM PHOSPHATE 10 MG/ML IJ SOLN
INTRAMUSCULAR | Status: DC | PRN
  Administered 2018-09-29: 10 mg via INTRAVENOUS

## 2018-09-29 MED ORDER — FENTANYL CITRATE (PF) 100 MCG/2ML IJ SOLN
INTRAMUSCULAR | Status: AC
  Filled 2018-09-29: qty 2

## 2018-09-29 MED ORDER — POLYETHYLENE GLYCOL 3350 17 G PO PACK: 17.0000 g | PACK | Freq: Every day | ORAL | Status: DC | PRN

## 2018-09-29 MED ORDER — BUPIVACAINE HCL (PF) 0.5 % IJ SOLN
INTRAMUSCULAR | Status: DC | PRN
  Administered 2018-09-29 (×2): 10 mL via PERINEURAL

## 2018-09-29 MED ORDER — ACETAMINOPHEN 325 MG PO TABS: 325.0000 mg | ORAL_TABLET | Freq: Four times a day (QID) | ORAL | Status: DC | PRN

## 2018-09-29 MED ORDER — METHOCARBAMOL 1000 MG/10ML IJ SOLN
500.0000 mg | Freq: Four times a day (QID) | INTRAVENOUS | Status: DC | PRN
  Filled 2018-09-29: qty 5

## 2018-09-29 MED ORDER — NETARSUDIL DIMESYLATE 0.02 % OP SOLN
1.0000 [drp] | Freq: Every day | OPHTHALMIC | Status: DC
  Administered 2018-10-01: 1 [drp] via OPHTHALMIC

## 2018-09-29 MED ORDER — METOCLOPRAMIDE HCL 5 MG PO TABS: 5.0000 mg | ORAL_TABLET | Freq: Three times a day (TID) | ORAL | Status: DC | PRN

## 2018-09-29 MED ORDER — LOSARTAN POTASSIUM 50 MG PO TABS
50.0000 mg | ORAL_TABLET | Freq: Every day | ORAL | Status: DC
  Administered 2018-09-30 – 2018-10-02 (×3): 50 mg via ORAL
  Filled 2018-09-29 (×3): qty 1

## 2018-09-29 MED ORDER — ONDANSETRON HCL 4 MG/2ML IJ SOLN: 4.0000 mg | Freq: Once | INTRAMUSCULAR | Status: DC | PRN

## 2018-09-29 MED ORDER — AMLODIPINE BESYLATE 5 MG PO TABS
5.0000 mg | ORAL_TABLET | Freq: Every day | ORAL | Status: DC
  Administered 2018-09-30 – 2018-10-02 (×2): 5 mg via ORAL
  Filled 2018-09-29 (×3): qty 1

## 2018-09-29 MED ORDER — OXYCODONE HCL 5 MG/5ML PO SOLN: 5.0000 mg | Freq: Once | ORAL | Status: DC | PRN

## 2018-09-29 MED ORDER — VITAMIN B-12 1000 MCG PO TABS
500.0000 ug | ORAL_TABLET | Freq: Every day | ORAL | Status: DC
  Administered 2018-09-30 – 2018-10-02 (×3): 500 ug via ORAL
  Filled 2018-09-29 (×3): qty 1

## 2018-09-29 MED ORDER — LACTATED RINGERS IV SOLN
INTRAVENOUS | Status: DC
  Administered 2018-09-29: 15:00:00 via INTRAVENOUS

## 2018-09-29 MED ORDER — PROPOFOL 10 MG/ML IV BOLUS
INTRAVENOUS | Status: DC | PRN
  Administered 2018-09-29: 20 mg via INTRAVENOUS
  Administered 2018-09-29: 70 mg via INTRAVENOUS

## 2018-09-29 MED ORDER — ESMOLOL HCL 100 MG/10ML IV SOLN
INTRAVENOUS | Status: AC
  Filled 2018-09-29: qty 10

## 2018-09-29 MED ORDER — PROPOFOL 10 MG/ML IV BOLUS
INTRAVENOUS | Status: AC
  Filled 2018-09-29: qty 20

## 2018-09-29 MED ORDER — BUPIVACAINE LIPOSOME 1.3 % IJ SUSP
INTRAMUSCULAR | Status: DC | PRN
  Administered 2018-09-29: 10 mL via PERINEURAL

## 2018-09-29 MED ORDER — CEFAZOLIN SODIUM-DEXTROSE 2-4 GM/100ML-% IV SOLN
2.0000 g | INTRAVENOUS | Status: AC
  Administered 2018-09-29: 2 g via INTRAVENOUS

## 2018-09-29 MED ORDER — MENTHOL 3 MG MT LOZG: 1.0000 | LOZENGE | OROMUCOSAL | Status: DC | PRN

## 2018-09-29 MED ORDER — LACTATED RINGERS IV SOLN
INTRAVENOUS | Status: DC | PRN
  Administered 2018-09-29: 07:00:00 via INTRAVENOUS

## 2018-09-29 MED ORDER — LEVOTHYROXINE SODIUM 50 MCG PO TABS
50.0000 ug | ORAL_TABLET | Freq: Every day | ORAL | Status: DC
  Administered 2018-09-30 – 2018-10-02 (×3): 50 ug via ORAL
  Filled 2018-09-29 (×3): qty 1

## 2018-09-29 MED ORDER — FENTANYL CITRATE (PF) 100 MCG/2ML IJ SOLN
25.0000 ug | INTRAMUSCULAR | Status: DC | PRN
  Administered 2018-09-29 (×2): 25 ug via INTRAVENOUS

## 2018-09-29 MED ORDER — ALUM & MAG HYDROXIDE-SIMETH 200-200-20 MG/5ML PO SUSP: 30.0000 mL | ORAL | Status: DC | PRN

## 2018-09-29 MED ORDER — FENTANYL CITRATE (PF) 100 MCG/2ML IJ SOLN
INTRAMUSCULAR | Status: DC | PRN
  Administered 2018-09-29 (×2): 50 ug via INTRAVENOUS
  Administered 2018-09-29 (×2): 25 ug via INTRAVENOUS

## 2018-09-29 MED ORDER — DOCUSATE SODIUM 100 MG PO CAPS
100.0000 mg | ORAL_CAPSULE | Freq: Two times a day (BID) | ORAL | Status: DC
  Administered 2018-09-30 – 2018-10-02 (×4): 100 mg via ORAL
  Filled 2018-09-29 (×6): qty 1

## 2018-09-29 MED ORDER — METHOCARBAMOL 500 MG PO TABS: 500.0000 mg | ORAL_TABLET | Freq: Four times a day (QID) | ORAL | Status: DC | PRN

## 2018-09-29 MED ORDER — LATANOPROST 0.005 % OP SOLN
1.0000 [drp] | Freq: Every day | OPHTHALMIC | Status: DC
  Administered 2018-09-29 – 2018-10-01 (×3): 1 [drp] via OPHTHALMIC
  Filled 2018-09-29: qty 2.5

## 2018-09-29 MED ORDER — OXYCODONE HCL 5 MG PO TABS: 5.0000 mg | ORAL_TABLET | Freq: Once | ORAL | Status: DC | PRN

## 2018-09-29 MED ORDER — ONDANSETRON HCL 4 MG/2ML IJ SOLN
INTRAMUSCULAR | Status: AC
  Filled 2018-09-29: qty 2

## 2018-09-29 MED ORDER — ROCURONIUM BROMIDE 50 MG/5ML IV SOSY
PREFILLED_SYRINGE | INTRAVENOUS | Status: DC | PRN
  Administered 2018-09-29: 50 mg via INTRAVENOUS

## 2018-09-29 MED ORDER — VANCOMYCIN HCL 1000 MG IV SOLR
INTRAVENOUS | Status: AC
  Filled 2018-09-29: qty 1000

## 2018-09-29 MED ORDER — SPIRONOLACTONE 25 MG PO TABS
25.0000 mg | ORAL_TABLET | Freq: Every day | ORAL | Status: DC
  Administered 2018-09-30 – 2018-10-02 (×3): 25 mg via ORAL
  Filled 2018-09-29 (×3): qty 1

## 2018-09-29 MED ORDER — ONDANSETRON HCL 4 MG PO TABS: 4.0000 mg | ORAL_TABLET | Freq: Four times a day (QID) | ORAL | Status: DC | PRN

## 2018-09-29 MED ORDER — ONDANSETRON HCL 4 MG/2ML IJ SOLN: 4.0000 mg | Freq: Four times a day (QID) | INTRAMUSCULAR | Status: DC | PRN

## 2018-09-29 MED ORDER — HYDROMORPHONE HCL 1 MG/ML IJ SOLN: 0.5000 mg | INTRAMUSCULAR | Status: DC | PRN

## 2018-09-29 MED ORDER — VANCOMYCIN HCL 1000 MG IV SOLR
INTRAVENOUS | Status: DC | PRN
  Administered 2018-09-29: 1000 mg via TOPICAL

## 2018-09-29 MED ORDER — ROCURONIUM BROMIDE 50 MG/5ML IV SOSY
PREFILLED_SYRINGE | INTRAVENOUS | Status: AC
  Filled 2018-09-29: qty 5

## 2018-09-29 MED ORDER — METOCLOPRAMIDE HCL 5 MG/ML IJ SOLN: 5.0000 mg | Freq: Three times a day (TID) | INTRAMUSCULAR | Status: DC | PRN

## 2018-09-29 MED ORDER — CHLORHEXIDINE GLUCONATE 4 % EX LIQD: 60.0000 mL | Freq: Once | CUTANEOUS | Status: DC

## 2018-09-29 MED ORDER — LIDOCAINE 2% (20 MG/ML) 5 ML SYRINGE
INTRAMUSCULAR | Status: AC
  Filled 2018-09-29: qty 5

## 2018-09-29 MED ORDER — SUGAMMADEX SODIUM 200 MG/2ML IV SOLN
INTRAVENOUS | Status: DC | PRN
  Administered 2018-09-29: 200 mg via INTRAVENOUS

## 2018-09-29 MED ORDER — CEFAZOLIN SODIUM-DEXTROSE 2-4 GM/100ML-% IV SOLN
INTRAVENOUS | Status: AC
  Filled 2018-09-29: qty 100

## 2018-09-29 MED ORDER — SODIUM CHLORIDE 0.9 % IV SOLN
INTRAVENOUS | Status: DC | PRN
  Administered 2018-09-29: 25 ug/min via INTRAVENOUS

## 2018-09-29 MED ORDER — LIDOCAINE 2% (20 MG/ML) 5 ML SYRINGE
INTRAMUSCULAR | Status: DC | PRN
  Administered 2018-09-29: 60 mg via INTRAVENOUS

## 2018-09-29 MED ORDER — BISACODYL 5 MG PO TBEC: 5.0000 mg | DELAYED_RELEASE_TABLET | Freq: Every day | ORAL | Status: DC | PRN

## 2018-09-29 MED ORDER — LATANOPROST 0.005 % OP SOLN
1.0000 [drp] | Freq: Every day | OPHTHALMIC | Status: DC
  Filled 2018-09-29: qty 2.5

## 2018-09-29 MED ORDER — DIPHENHYDRAMINE HCL 12.5 MG/5ML PO ELIX: 12.5000 mg | ORAL_SOLUTION | ORAL | Status: DC | PRN

## 2018-09-29 MED ORDER — KETOROLAC TROMETHAMINE 15 MG/ML IJ SOLN
7.5000 mg | Freq: Four times a day (QID) | INTRAMUSCULAR | Status: AC
  Administered 2018-09-29 (×2): 7.5 mg via INTRAVENOUS
  Filled 2018-09-29 (×3): qty 1

## 2018-09-29 SURGICAL SUPPLY — 58 items
BASEPLATE GLENOID SHLDR SM (Shoulder) ×3 IMPLANT
BLADE SAW SGTL 83.5X18.5 (BLADE) ×3 IMPLANT
COVER SURGICAL LIGHT HANDLE (MISCELLANEOUS) ×3 IMPLANT
COVER WAND RF STERILE (DRAPES) ×3 IMPLANT
CUP SUT UNIV REVERS 36 NEUTRAL (Cup) ×3 IMPLANT
DERMABOND ADHESIVE PROPEN (GAUZE/BANDAGES/DRESSINGS) ×2
DERMABOND ADVANCED (GAUZE/BANDAGES/DRESSINGS) ×2
DERMABOND ADVANCED .7 DNX12 (GAUZE/BANDAGES/DRESSINGS) ×1 IMPLANT
DERMABOND ADVANCED .7 DNX6 (GAUZE/BANDAGES/DRESSINGS) ×1 IMPLANT
DRAPE ORTHO SPLIT 77X108 STRL (DRAPES) ×4
DRAPE SURG 17X11 SM STRL (DRAPES) ×3 IMPLANT
DRAPE SURG ORHT 6 SPLT 77X108 (DRAPES) ×2 IMPLANT
DRAPE U-SHAPE 47X51 STRL (DRAPES) ×3 IMPLANT
DRESSING AQUACEL AG SP 3.5X10 (GAUZE/BANDAGES/DRESSINGS) ×1 IMPLANT
DRSG AQUACEL AG ADV 3.5X10 (GAUZE/BANDAGES/DRESSINGS) ×3 IMPLANT
DRSG AQUACEL AG SP 3.5X10 (GAUZE/BANDAGES/DRESSINGS) ×3
DURAPREP 26ML APPLICATOR (WOUND CARE) ×3 IMPLANT
ELECT BLADE 4.0 EZ CLEAN MEGAD (MISCELLANEOUS) ×3
ELECT CAUTERY BLADE 6.4 (BLADE) ×3 IMPLANT
ELECT REM PT RETURN 9FT ADLT (ELECTROSURGICAL) ×3
ELECTRODE BLDE 4.0 EZ CLN MEGD (MISCELLANEOUS) ×1 IMPLANT
ELECTRODE REM PT RTRN 9FT ADLT (ELECTROSURGICAL) ×1 IMPLANT
FACESHIELD WRAPAROUND (MASK) ×9 IMPLANT
GLENOSPHERE LATERAL 36MM+4 (Shoulder) ×3 IMPLANT
GLOVE BIO SURGEON STRL SZ7.5 (GLOVE) ×3 IMPLANT
GLOVE BIO SURGEON STRL SZ8 (GLOVE) ×3 IMPLANT
GLOVE EUDERMIC 7 POWDERFREE (GLOVE) ×3 IMPLANT
GLOVE SS BIOGEL STRL SZ 7.5 (GLOVE) ×1 IMPLANT
GLOVE SUPERSENSE BIOGEL SZ 7.5 (GLOVE) ×2
GOWN STRL REUS W/ TWL LRG LVL3 (GOWN DISPOSABLE) ×1 IMPLANT
GOWN STRL REUS W/ TWL XL LVL3 (GOWN DISPOSABLE) ×2 IMPLANT
GOWN STRL REUS W/TWL LRG LVL3 (GOWN DISPOSABLE) ×2
GOWN STRL REUS W/TWL XL LVL3 (GOWN DISPOSABLE) ×4
INSERT HUMERAL 36 +6 (Shoulder) ×3 IMPLANT
KIT BASIN OR (CUSTOM PROCEDURE TRAY) ×3 IMPLANT
KIT TURNOVER KIT B (KITS) ×3 IMPLANT
MANIFOLD NEPTUNE II (INSTRUMENTS) ×3 IMPLANT
NEEDLE TAPERED W/ NITINOL LOOP (MISCELLANEOUS) ×3 IMPLANT
NS IRRIG 1000ML POUR BTL (IV SOLUTION) ×3 IMPLANT
PACK SHOULDER (CUSTOM PROCEDURE TRAY) ×3 IMPLANT
PAD ARMBOARD 7.5X6 YLW CONV (MISCELLANEOUS) ×6 IMPLANT
RESTRAINT HEAD UNIVERSAL NS (MISCELLANEOUS) ×3 IMPLANT
SCREW CENTRAL NONLOCK 6.5X20MM (Shoulder) ×3 IMPLANT
SCREW LOCK PERIPHERAL 30MM (Shoulder) ×6 IMPLANT
SET PIN UNIVERSAL REVERSE (SET/KITS/TRAYS/PACK) ×3 IMPLANT
SLING ARM FOAM STRAP LRG (SOFTGOODS) IMPLANT
SLING ARM IMMOBILIZER MED (SOFTGOODS) ×3 IMPLANT
SPONGE LAP 18X18 X RAY DECT (DISPOSABLE) ×3 IMPLANT
SPONGE LAP 4X18 RFD (DISPOSABLE) ×3 IMPLANT
STEM HUMERAL MOD SZ 5 135 DEG (Stem) ×3 IMPLANT
SUT MNCRL AB 3-0 PS2 18 (SUTURE) ×3 IMPLANT
SUT MON AB 2-0 CT1 27 (SUTURE) ×3 IMPLANT
SUT VIC AB 1 CT1 27 (SUTURE) ×2
SUT VIC AB 1 CT1 27XBRD ANBCTR (SUTURE) ×1 IMPLANT
SUTURE TAPE 1.3 40 TPR END (SUTURE) ×1 IMPLANT
SUTURETAPE 1.3 40 TPR END (SUTURE) ×3
TOWEL OR 17X26 10 PK STRL BLUE (TOWEL DISPOSABLE) ×3 IMPLANT
WATER STERILE IRR 1000ML POUR (IV SOLUTION) ×3 IMPLANT

## 2018-09-29 NOTE — Anesthesia Procedure Notes (Signed)
Anesthesia Regional Block: Interscalene brachial plexus block   Pre-Anesthetic Checklist: ,, timeout performed, Correct Patient, Correct Site, Correct Laterality, Correct Procedure, Correct Position, site marked, Risks and benefits discussed,  Surgical consent,  Pre-op evaluation,  At surgeon's request and post-op pain management  Laterality: Left  Prep: chloraprep       Needles:  Injection technique: Single-shot  Needle Type: Echogenic Stimulator Needle     Needle Length: 9cm  Needle Gauge: 21     Additional Needles:   Procedures:,,,, ultrasound used (permanent image in chart),,,,  Narrative:  Start time: 09/29/2018 11:00 AM End time: 09/29/2018 11:08 AM Injection made incrementally with aspirations every 5 mL.  Performed by: Personally  Anesthesiologist: Lidia Collum, MD  Additional Notes: Monitors applied. Injection made in 5cc increments. No resistance to injection. Good needle visualization. Patient tolerated procedure well.  RESCUE BLOCK IN PACU, DO NOT BILL.

## 2018-09-29 NOTE — Anesthesia Procedure Notes (Signed)
Anesthesia Regional Block: Interscalene brachial plexus block   Pre-Anesthetic Checklist: ,, timeout performed, Correct Patient, Correct Site, Correct Laterality, Correct Procedure, Correct Position, site marked, Risks and benefits discussed,  Surgical consent,  Pre-op evaluation,  At surgeon's request and post-op pain management  Laterality: Left  Prep: chloraprep       Needles:  Injection technique: Single-shot  Needle Type: Echogenic Stimulator Needle     Needle Length: 9cm  Needle Gauge: 21     Additional Needles:   Procedures:, nerve stimulator,,, ultrasound used (permanent image in chart),,,,   Nerve Stimulator or Paresthesia:  Response: biceps, 0.8 mA,   Additional Responses:   Narrative:  Start time: 09/29/2018 7:14 AM End time: 09/29/2018 7:23 AM Injection made incrementally with aspirations every 5 mL.  Performed by: Personally  Anesthesiologist: Lidia Collum, MD  Additional Notes: Monitors applied. Injection made in 5cc increments. No resistance to injection. Good needle visualization. Patient tolerated procedure well.

## 2018-09-29 NOTE — Transfer of Care (Signed)
Immediate Anesthesia Transfer of Care Note  Patient: Valerie Molina  Procedure(s) Performed: left shoulder hardware removal, conversion to Reverse shoulder arthroplasty (Left Shoulder)  Patient Location: PACU  Anesthesia Type:General  Level of Consciousness: awake, alert  and oriented  Airway & Oxygen Therapy: Patient Spontanous Breathing and Patient connected to nasal cannula oxygen  Post-op Assessment: Report given to RN, Post -op Vital signs reviewed and stable and Patient moving all extremities X 4  Post vital signs: Reviewed and stable  Last Vitals:  Vitals Value Taken Time  BP 109/95 09/29/2018 10:03 AM  Temp    Pulse 63 09/29/2018 10:06 AM  Resp 10 09/29/2018 10:06 AM  SpO2 100 % 09/29/2018 10:06 AM  Vitals shown include unvalidated device data.  Last Pain:  Vitals:   09/29/18 0559  PainSc: 0-No pain      Patients Stated Pain Goal: 5 (75/05/18 3358)  Complications: No apparent anesthesia complications

## 2018-09-29 NOTE — H&P (Signed)
Valerie Molina    Chief Complaint: left shoulder proximal humeral non-union HPI: The patient is a 84 y.o. female With failed ORIF left proximal humerus fracture  Past Medical History:  Diagnosis Date  . Dysrhythmia   . Hypertension   . PONV (postoperative nausea and vomiting)     Past Surgical History:  Procedure Laterality Date  . ABDOMINAL HYSTERECTOMY     14-15 years ago  . EYE SURGERY     "1 eye about 15 years ago; other eye about 10 years ago"  . SHOULDER SURGERY  02/2017  . TONSILLECTOMY     removed at age 60-23    History reviewed. No pertinent family history.  Social History:  reports that she has never smoked. She has never used smokeless tobacco. She reports that she does not drink alcohol or use drugs.   Medications Prior to Admission  Medication Sig Dispense Refill  . amLODipine (NORVASC) 5 MG tablet Take 5 mg by mouth daily.    . dorzolamide-timolol (COSOPT) 22.3-6.8 MG/ML ophthalmic solution Place 1 drop into both eyes 2 (two) times daily.    Marland Kitchen latanoprost (XALATAN) 0.005 % ophthalmic solution Place 1 drop into both eyes at bedtime.    Marland Kitchen levothyroxine (SYNTHROID, LEVOTHROID) 50 MCG tablet Take 50 mcg by mouth daily before breakfast.    . losartan (COZAAR) 50 MG tablet Take 50 mg by mouth daily.    Mckinley Jewel Dimesylate (RHOPRESSA) 0.02 % SOLN Place 1 drop into both eyes daily.    Marland Kitchen spironolactone (ALDACTONE) 25 MG tablet Take 25 mg by mouth daily.    . vitamin B-12 (CYANOCOBALAMIN) 500 MCG tablet Take 500 mcg by mouth daily.       Physical Exam: Left shoulder severely painful and restricted motion of the left shoulder, xrays showing loose and migrating hardware  Vitals     Assessment/Plan  Impression: left shoulder proximal humeral non-union  Plan of Action: Procedure(s): left shoulder hardware removal, conversion to Reverse shoulder arthroplasty  Orvil Faraone M Duward Allbritton 09/29/2018, 6:01 AM Contact # [phone]

## 2018-09-29 NOTE — Anesthesia Postprocedure Evaluation (Signed)
Anesthesia Post Note  Patient: Valerie Molina  Procedure(s) Performed: left shoulder hardware removal, conversion to Reverse shoulder arthroplasty (Left Shoulder)     Patient location during evaluation: PACU Anesthesia Type: General Level of consciousness: awake and alert Pain management: pain level controlled Vital Signs Assessment: post-procedure vital signs reviewed and stable Respiratory status: spontaneous breathing, nonlabored ventilation and respiratory function stable Cardiovascular status: blood pressure returned to baseline and stable Postop Assessment: no apparent nausea or vomiting Anesthetic complications: no    Last Vitals:  Vitals:   09/29/18 1240 09/29/18 1309  BP:  127/61  Pulse:  66  Resp:  18  Temp: (!) 36.4 C (!) 36.3 C  SpO2:  99%    Last Pain:  Vitals:   09/29/18 1309  TempSrc: Oral  PainSc: 0-No pain                 Lidia Collum

## 2018-09-29 NOTE — Op Note (Signed)
09/29/2018  9:51 AM  PATIENT:   Valerie Molina  84 y.o. female  PRE-OPERATIVE DIAGNOSIS:  left shoulder proximal humeral non-union with loose hardware  POST-OPERATIVE DIAGNOSIS:  same  PROCEDURE:  1: hardware removal L proximal humerus 2: L RSA #5.5 Arthrex stem, +6poly, 36/+4 glenosphere, small baseplate  SURGEON:  Mariya Mottley, Metta Clines M.D.  ASSISTANTS: Shuford pac   ANESTHESIA:   GET + ISB  EBL: 200  SPECIMEN:  none  Drains: none   PATIENT DISPOSITION:  PACU - hemodynamically stable.    PLAN OF CARE: Admit for overnight observation  Brief history:  Valerie Molina is status post a previous left proximal humeral fracture for which she had undergone an open reduction and internal fixation previously.  Unfortunately she went on to nonunion and presented for evaluation of painful left shoulder with profound limitations in mobility and function.  Radiographs were obtained showing that her hardware had become loose with several screws migrating established nonunion of the proximal humeral fracture site.  Due to her continued pain and significant functional rotation she is brought to the operating this time for planned hardware removal and conversion to a reverse arthroplasty  Preoperatively and counseled Valerie Molina regarding treatment options as well as the potential risks versus benefits thereof.  Possible surgical complications were reviewed including potential bleeding, infection, neurovascular injury, persistent pain, anesthetic complication, failure the implant, and possible need for additional surgery.  She understands and accepts and agrees to the planned procedure.  Procedure detail:  After undergoing routine preop evaluation patient did receive prophylactic antibiotics and an interscalene block with Exparel was established in the holding area by the anesthesia department.  Placed supine on the operative table underwent smooth induction of a general endotracheal anesthesia  although she was found to have a very "anterior" tracheal.  Smooth induction and intubation was accomplished however.  She is then placed in the beachchair position and appropriate padding protected.  The left shoulder girdle region was sterilely prepped and draped in standard fashion.  Timeout was called.  Through her previous anterior shoulder incision we approach using the prior incision with sharp dissection with cautery was used for hemostasis.  Dissection carried deeply in the previous deltopectoral interval was then identified and established with significant scar tissue noted to tween the tissue planes and careful dissection was then performed allowing [place] to mobilize the conjoined tendon and then divided adhesions beneath the deltoid.  The plate was then exposed and the overlying soft tissues were removed there was some significant metallosis in the soft tissues which were removed in its entirety.  Plate was then removed without difficulty.  This allowed visualization of the nonunion site which had gross mobility.  This point then I performed a complete dissection removing soft tissues from the humeral head about the greater and lesser tuberosities and ultimately were able to excise the head in its entirety and paste placed tag sutures through the superior posterior rotator cuff for later repair.  Once the head was excised we began exposure of the glenoid and performed a circumferential labral resection placed a guidepin into the center of the glenoid with a slight inferior tilt of approximately 10 degrees and then prepared the glenoid with central peripheral reamers and a small baseplate was then impacted and transfixed with the central lag screw in the inferior and superior locking screws all of which obtained excellent purchase and fixation.  A 36+4 glenosphere was then impacted.  We then returned our attention to the humeral canal  and used a thin guidewire to confirm that we were within the humeral canal  followed by a cannulated reamer and then hand reamed to a size 6 ultimately broached to a size 5.5 stem at approximate 20 degrees retroversion and with a trial implant that showed good soft tissue balance good shoulder motion and stability.  At this point on the back table I assembled the size 5.5 stem stem with a neutral metaphysis.  This was then impacted into the humeral canal with excellent interference fit fixation.  A series of trial reductions were then performed this showed good soft tissue balance.  We ultimately selected the +6 poly-which was then impacted and final reduction was then performed.  I should mention that I did place vancomycin powder into the humeral canal prior to insertion of the stem and then as we closed the wound total of vancomycin power powder was placed liberally throughout the soft tissues of the joint.  I did repair the superior and posterior rotator cuff to the eyelets on the collar of our implant.  The deltopectoral interval was then repaired with a series of figure-of-eight and 1 Vicryl sutures.  2-0 Vicryl used with subcu layer intracuticular through Monocryl for the skin followed by Dermabond and Aquasol dressing was placed in sling patient awakened extubated taken in stable condition  Olivia Mackie for PA-C was used as an Environmental consultant throughout this case essential for help with positioning patient, position extremity, tissue manipulation, implantation prosthesis, wound closure, and intraoperative decision-making.  Marin Shutter MD   Contact # [phone]

## 2018-09-29 NOTE — Progress Notes (Signed)
Admitted to room 6n19 from PACU at this time. A/Ox4, denies pain, c/o nausea from movement of stretcher.

## 2018-09-29 NOTE — Anesthesia Procedure Notes (Addendum)
Procedure Name: Intubation Date/Time: 09/29/2018 7:48 AM Performed by: Kyung Rudd, CRNA Pre-anesthesia Checklist: Patient identified, Emergency Drugs available, Suction available and Patient being monitored Patient Re-evaluated:Patient Re-evaluated prior to induction Oxygen Delivery Method: Circle system utilized Preoxygenation: Pre-oxygenation with 100% oxygen Induction Type: IV induction Ventilation: Mask ventilation without difficulty Laryngoscope Size: Miller and 2 Grade View: Grade III Tube type: Oral Tube size: 7.0 mm Number of attempts: 2 Airway Equipment and Method: Bougie stylet Placement Confirmation: ETT inserted through vocal cords under direct vision,  positive ETCO2 and breath sounds checked- equal and bilateral Secured at: 21 cm Tube secured with: Tape Dental Injury: Teeth and Oropharynx as per pre-operative assessment  Comments: DL x1 with MAC 3, unable to visualize vocal cords by CRNA. 2nd DL with Sabra Heck 2 by Dr. Christella Hartigan, Grade III view. AOI using blue bougie. Pt with anterior larynx.

## 2018-09-29 NOTE — Social Work (Signed)
CSW acknowledging consult for SNF placement. Will follow for therapy recommendations.   Stephen Baruch H Jahaan Vanwagner, LCSWA [facility] [phone]   

## 2018-09-29 NOTE — Discharge Instructions (Signed)
° °Kevin M. Supple, M.D., F.A.A.O.S. °Orthopaedic Surgery °Specializing in Arthroscopic and Reconstructive °Surgery of the Shoulder and Knee °[phone] °[address] - [place], [place] [address] - Fax [phone] ° ° °POST-OP TOTAL SHOULDER REPLACEMENT INSTRUCTIONS ° °1. Call the office at [phone] to schedule your first post-op appointment 10-14 days from the date of your surgery. ° °2. The bandage over your incision is waterproof. You may begin showering with this dressing on. You may leave this dressing on until first follow up appointment within 2 weeks. We prefer you leave this dressing in place until follow up however after 5-7 days if you are having itching or skin irritation and would like to remove it you may do so. Go slow and tug at the borders gently to break the bond the dressing has with the skin. At this point if there is no drainage it is okay to go without a bandage or you may cover it with a light guaze and tape. You can also expect significant bruising around your shoulder that will drift down your arm and into your chest wall. This is very normal and should resolve over several days. ° ° 3. Wear your sling/immobilizer at all times except to perform the exercises below or to occasionally let your arm dangle by your side to stretch your elbow. You also need to sleep in your sling immobilizer until instructed otherwise. ° °4. Range of motion to your elbow, wrist, and hand are encouraged 3-5 times daily. Exercise to your hand and fingers helps to reduce swelling you may experience. ° °5. Utilize ice to the shoulder 3-5 times minimum a day and additionally if you are experiencing pain. ° °6. Prescriptions for a pain medication and a muscle relaxant are provided for you. It is recommended that if you are experiencing pain that you pain medication alone is not controlling, add the muscle relaxant along with the pain medication which can give additional pain relief. The first 1-2 days  is generally the most severe of your pain and then should gradually decrease. As your pain lessens it is recommended that you decrease your use of the pain medications to an "as needed basis'" only and to always comply with the recommended dosages of the pain medications. ° °7. Pain medications can produce constipation along with their use. If you experience this, the use of an over the counter stool softener or laxative daily is recommended.  ° °8. For additional questions or concerns, please do not hesitate to call the office. If after hours there is an answering service to forward your concerns to the physician on call. ° °9.Pain control following an exparel block ° °To help control your post-operative pain you received a nerve block  performed with Exparel which is a long acting anesthetic (numbing agent) which can provide pain relief and sensations of numbness (and relief of pain) in the operative shoulder and arm for up to 3 days. Sometimes it provides mixed relief, meaning you may still have numbness in certain areas of the arm but can still be able to move  parts of that arm, hand, and fingers. We recommend that your prescribed pain medications  be used as needed. We do not feel it is necessary to "pre medicate" and "stay ahead" of pain.  Taking narcotic pain medications when you are not having any pain can lead to unnecessary and potentially dangerous side effects.  ° °POST-OP EXERCISES ° °Pendulum Exercises ° °Perform pendulum exercises while standing and bending at   the waist. Support your uninvolved arm on a table or chair and allow your operated arm to hang freely. Make sure to do these exercises passively - not using you shoulder muscles. ° °Repeat 20 times. Do 3 sessions per day. ° ° ° ° °

## 2018-09-29 NOTE — Care Management Note (Signed)
Case Management Note  Patient Details  Name: Valerie Molina MRN: [id number] Date of Birth: 12-05-1926  Subjective/Objective:                    Action/Plan:  Await recommendations. Orders for home health and SW consult for SNF placement  Expected Discharge Date:                  Expected Discharge Plan:     In-House Referral:     Discharge planning Services  CM Consult  Post Acute Care Choice:  Durable Medical Equipment, Home Health Choice offered to:     DME Arranged:    DME Agency:     HH Arranged:    HH Agency:     Status of Service:  In process, will continue to follow  If discussed at Long Length of Stay Meetings, dates discussed:    Additional Comments:  Marilu Favre, RN 09/29/2018, 2:03 PM

## 2018-09-30 ENCOUNTER — Encounter (HOSPITAL_COMMUNITY): Payer: Self-pay | Admitting: Orthopedic Surgery

## 2018-09-30 MED ORDER — ACETAMINOPHEN 325 MG PO TABS: 325.0000 mg | ORAL_TABLET | Freq: Four times a day (QID) | ORAL | 0 refills | Status: AC | PRN

## 2018-09-30 MED ORDER — TRAMADOL HCL 50 MG PO TABS: 50.0000 mg | ORAL_TABLET | Freq: Four times a day (QID) | ORAL | 0 refills | Status: DC | PRN

## 2018-09-30 NOTE — Progress Notes (Signed)
XITLALIC MASLIN  MRN: [id number] DOB/Age: 82-17-1927 84 y.o. [facility] Procedure: Procedure(s) (LRB): left shoulder hardware removal, conversion to Reverse shoulder arthroplasty (Left)     Subjective: Up in a chair, currently no complaints of pain  Vital Signs Temp:  [97.6 F (36.4 C)-98.7 F (37.1 C)] 97.6 F (36.4 C) (10/25 1303) Pulse Rate:  [63-78] 67 (10/25 1303) Resp:  [16] 16 (10/25 0511) BP: (101-128)/(47-53) 116/47 (10/25 1303) SpO2:  [94 %-97 %] 97 % (10/25 1303)  Lab Results No results for input(s): WBC, HGB, HCT, PLT in the last 72 hours. BMET No results for input(s): NA, K, CL, CO2, GLUCOSE, BUN, CREATININE, CALCIUM in the last 72 hours. No results found for: INR   Exam Left shoulder dressing dry.  NVI         Plan Will continue to work with Social Work to find a skilled facility [facility] IV Cont PT/OT to work on Musician Jessamine Barcia PA-C  09/30/2018, 1:25 PM Contact # [phone]

## 2018-09-30 NOTE — Evaluation (Signed)
Physical Therapy Evaluation Patient Details Name: Valerie Molina MRN: [id number] DOB: 1926/04/22 Today's Date: 09/30/2018   History of Present Illness  pt is s/p left shoulder hardware removal, conversion to Reverse shoulder arthroplasty on 09/29/18  Clinical Impression  Pt presents with deficits in strength, gait and balance and will benefit from skilled PT to address deficits and improve functional mobility    Follow Up Recommendations SNF    Equipment Recommendations  None recommended by PT    Recommendations for Other Services       Precautions / Restrictions Precautions Precautions: Fall Required Braces or Orthoses: Sling Restrictions Weight Bearing Restrictions: Yes LUE Weight Bearing: Non weight bearing      Mobility  Bed Mobility Overal bed mobility: Needs Assistance Bed Mobility: Supine to Sit     Supine to sit: Modified independent (Device/Increase time)     General bed mobility comments: increased time due to Lt UE in sling  Transfers Overall transfer level: Needs assistance Equipment used: 1 person hand held assist Transfers: Sit to/from Stand Sit to Stand: Min assist         General transfer comment: pt requires lifting assist to stand from bed, steadying assist when standing  Ambulation/Gait Ambulation/Gait assistance: Mod assist Gait Distance (Feet): 50 Feet Assistive device: None       General Gait Details: pt requires assist for balance and wt shifts in standing. steadying assist due to c/o fatigue and soreness from surgery. pt very motivated to improve  Financial trader Rankin (Stroke Patients Only)       Balance Overall balance assessment: Needs assistance         Standing balance support: During functional activity Standing balance-Leahy Scale: Poor Standing balance comment: pt with 1 LOB backward requiring her to sit quickly on bed, unable to catch her balance without  assistance                             Pertinent Vitals/Pain Pain Assessment: No/denies pain    Home Living Family/patient expects to be discharged to:: Other (Comment)(independent living) Living Arrangements: Alone               Additional Comments: pt lives at [facility] independent living    Prior Function Level of Independence: Independent               Hand Dominance   Dominant Hand: Right    Extremity/Trunk Assessment   Upper Extremity Assessment Upper Extremity Assessment: Defer to OT evaluation    Lower Extremity Assessment Lower Extremity Assessment: Generalized weakness    Cervical / Trunk Assessment Cervical / Trunk Assessment: Kyphotic  Communication   Communication: No difficulties  Cognition Arousal/Alertness: Awake/alert Behavior During Therapy: WFL for tasks assessed/performed Overall Cognitive Status: Within Functional Limits for tasks assessed                                        General Comments      Exercises     Assessment/Plan    PT Assessment Patient needs continued PT services  PT Problem List Decreased strength;Decreased mobility;Decreased range of motion;Decreased activity tolerance;Decreased balance;Decreased knowledge of use of DME       PT Treatment Interventions DME instruction;Therapeutic activities;Modalities;Gait training;Therapeutic exercise;Patient/family education;Balance training;Functional mobility training;Neuromuscular re-education;Manual  techniques    PT Goals (Current goals can be found in the Care Plan section)  Acute Rehab PT Goals Patient Stated Goal: feel better PT Goal Formulation: With patient Time For Goal Achievement: 10/07/18 Potential to Achieve Goals: Good    Frequency Min 3X/week   Barriers to discharge Decreased caregiver support      Co-evaluation               AM-PAC PT "6 Clicks" Daily Activity  Outcome Measure Difficulty turning over in bed  (including adjusting bedclothes, sheets and blankets)?: Unable Difficulty moving from lying on back to sitting on the side of the bed? : A Little Difficulty sitting down on and standing up from a chair with arms (e.g., wheelchair, bedside commode, etc,.)?: A Lot Help needed moving to and from a bed to chair (including a wheelchair)?: A Lot Help needed walking in hospital room?: A Lot Help needed climbing 3-5 steps with a railing? : A Lot 6 Click Score: 12    End of Session   Activity Tolerance: Patient tolerated treatment well Patient left: in chair;with call bell/phone within reach;with family/visitor present Nurse Communication: Mobility status PT Visit Diagnosis: Muscle weakness (generalized) (M62.81);Difficulty in walking, not elsewhere classified (R26.2)    Time: 6861-6837 PT Time Calculation (min) (ACUTE ONLY): 12 min   Charges:   PT Evaluation $PT Eval Moderate Complexity: 1 Mod          Isabelle Course, PT, DPT  Sajid Ruppert 09/30/2018, 10:35 AM

## 2018-09-30 NOTE — NC FL2 (Signed)
[place] LEVEL OF CARE SCREENING TOOL     IDENTIFICATION  Patient Name: Valerie Molina Birthdate: 1926-11-30 Sex: female Admission Date (Current Location): 09/29/2018  [facility] and [place] Number:  Herbalist and Address:  [facility] [place] [place] [address] [place], [place] [address]      Provider Number: [id number]  Attending Physician Name and Address:  Justice Britain, MD  Relative Name and Phone Number:  Krupa Stege; daughter; [phone]    Current Level of Care: Hospital Recommended Level of Care: [place] Prior Approval Number:    Date Approved/Denied:   PASRR Number: [id number]  Discharge Plan: SNF    Current Diagnoses: Patient Active Problem List   Diagnosis Date Noted  . S/p reverse total shoulder arthroplasty 09/29/2018    Orientation RESPIRATION BLADDER Height & Weight     Self, Time, Situation, Place  Normal Continent Weight:   Height:     BEHAVIORAL SYMPTOMS/MOOD NEUROLOGICAL BOWEL NUTRITION STATUS      Continent Diet(see discharge summary)  AMBULATORY STATUS COMMUNICATION OF NEEDS Skin   Limited Assist Verbally Surgical wounds(incision on left shoulder with hydrocolloid dressing and sling)                       Personal Care Assistance Level of Assistance  Bathing, Feeding, Dressing Bathing Assistance: Maximum assistance Feeding assistance: Limited assistance Dressing Assistance: Maximum assistance     Functional Limitations Info  Sight, Hearing, Speech Sight Info: Adequate Hearing Info: Adequate Speech Info: Adequate    SPECIAL CARE FACTORS FREQUENCY  PT (By licensed PT), OT (By licensed OT)     PT Frequency: 5x week OT Frequency: 5x week            Contractures Contractures Info: Not present    Additional Factors Info  Code Status, Allergies Code Status Info: Full Code Allergies Info: No Known Allergies           Current Medications (09/30/2018):   This is the current hospital active medication list Current Facility-Administered Medications  Medication Dose Route Frequency Provider Last Rate Last Dose  . acetaminophen (TYLENOL) tablet 325-650 mg  325-650 mg Oral Q6H PRN Shuford, Tracy, PA-C      . alum & mag hydroxide-simeth (MAALOX/MYLANTA) 200-200-20 MG/5ML suspension 30 mL  30 mL Oral Q4H PRN Shuford, Tracy, PA-C      . amLODipine (NORVASC) tablet 5 mg  5 mg Oral Daily Shuford, Tracy, PA-C   5 mg at 09/30/18 0856  . bisacodyl (DULCOLAX) EC tablet 5 mg  5 mg Oral Daily PRN Shuford, Olivia Mackie, PA-C      . diphenhydrAMINE (BENADRYL) 12.5 MG/5ML elixir 12.5-25 mg  12.5-25 mg Oral Q4H PRN Shuford, Tracy, PA-C      . docusate sodium (COLACE) capsule 100 mg  100 mg Oral BID Shuford, Tracy, PA-C   100 mg at 09/30/18 0856  . dorzolamide-timolol (COSOPT) 22.3-6.8 MG/ML ophthalmic solution 1 drop  1 drop Both Eyes BID Shuford, Tracy, PA-C   1 drop at 09/30/18 0857  . HYDROmorphone (DILAUDID) injection 0.5-1 mg  0.5-1 mg Intravenous Q4H PRN Shuford, Tracy, PA-C      . ketorolac (TORADOL) 15 MG/ML injection 7.5 mg  7.5 mg Intravenous Q6H Shuford, Tracy, PA-C   7.5 mg at 09/29/18 2323  . lactated ringers infusion   Intravenous Continuous Shuford, Olivia Mackie, PA-C   Stopped at 09/30/18 0143  . latanoprost (XALATAN) 0.005 % ophthalmic solution 1 drop  1  drop Both Eyes QHS Justice Britain, MD   1 drop at 09/29/18 2324  . levothyroxine (SYNTHROID, LEVOTHROID) tablet 50 mcg  50 mcg Oral QAC breakfast Shuford, Tracy, PA-C   50 mcg at 09/30/18 6962  . losartan (COZAAR) tablet 50 mg  50 mg Oral Daily Shuford, Tracy, PA-C   50 mg at 09/30/18 0856  . magnesium citrate solution 1 Bottle  1 Bottle Oral Once PRN Shuford, Olivia Mackie, PA-C      . menthol-cetylpyridinium (CEPACOL) lozenge 3 mg  1 lozenge Oral PRN Shuford, Olivia Mackie, PA-C       Or  . phenol (CHLORASEPTIC) mouth spray 1 spray  1 spray Mouth/Throat PRN Shuford, Tracy, PA-C      . methocarbamol (ROBAXIN) tablet 500 mg  500  mg Oral Q6H PRN Shuford, Tracy, PA-C       Or  . methocarbamol (ROBAXIN) 500 mg in dextrose 5 % 50 mL IVPB  500 mg Intravenous Q6H PRN Shuford, Tracy, PA-C      . metoCLOPramide (REGLAN) tablet 5-10 mg  5-10 mg Oral Q8H PRN Shuford, Tracy, PA-C       Or  . metoCLOPramide (REGLAN) injection 5-10 mg  5-10 mg Intravenous Q8H PRN Shuford, Olivia Mackie, PA-C      . Netarsudil Dimesylate 0.02 % SOLN 1 drop  1 drop Both Eyes Daily Shuford, Tracy, PA-C      . ondansetron (ZOFRAN) tablet 4 mg  4 mg Oral Q6H PRN Shuford, Tracy, PA-C       Or  . ondansetron (ZOFRAN) injection 4 mg  4 mg Intravenous Q6H PRN Shuford, Tracy, PA-C      . oxyCODONE (Oxy IR/ROXICODONE) immediate release tablet 5 mg  5 mg Oral Q4H PRN Shuford, Tracy, PA-C      . polyethylene glycol (MIRALAX / GLYCOLAX) packet 17 g  17 g Oral Daily PRN Shuford, Tracy, PA-C      . spironolactone (ALDACTONE) tablet 25 mg  25 mg Oral Daily Shuford, Tracy, PA-C   25 mg at 09/30/18 0856  . traMADol (ULTRAM) tablet 50 mg  50 mg Oral Q6H PRN Shuford, Tracy, PA-C      . vitamin B-12 (CYANOCOBALAMIN) tablet 500 mcg  500 mcg Oral Daily Shuford, Tracy, PA-C   500 mcg at 09/30/18 9528     Discharge Medications: Please see discharge summary for a list of discharge medications.  Relevant Imaging Results:  Relevant Lab Results:   Additional Information SS#[id number] [place], [place]

## 2018-09-30 NOTE — Social Work (Signed)
CSW acknowledging additional consult for [facility] services. Continue to await therapy recommendations, should HH be appropriate pt will be f/u by RN Case Manager who is also following pt progress.  Westley Hummer, MSW, [place] Work [phone]

## 2018-09-30 NOTE — Evaluation (Signed)
Occupational Therapy Evaluation Patient Details Name: Valerie Molina MRN: [id number] DOB: 1926/09/17 Today's Date: 09/30/2018    History of Present Illness pt is s/p left shoulder hardware removal, conversion to Reverse shoulder arthroplasty on 09/29/18   Clinical Impression   Pt admitted with the above diagnoses and presents with below problem list. Pt will benefit from continued acute OT to address the below listed deficits and maximize independence with basic ADLs prior to d/c to venue below. PTA pt was independent with ADLs and lives alone. Pt is currently min to mod A with ADLs, toilet transfers, and functional mobility. Daughter-in-law present throughout session. Recommend ST SNF for rehab prior to returning home.      Follow Up Recommendations  SNF    Equipment Recommendations  Other (comment)(defer to next venue)    Recommendations for Other Services       Precautions / Restrictions Precautions Precautions: Fall Required Braces or Orthoses: Sling Restrictions Weight Bearing Restrictions: Yes LUE Weight Bearing: Non weight bearing      Mobility Bed Mobility Overal bed mobility: Needs Assistance Bed Mobility: Supine to Sit     Supine to sit: Modified independent (Device/Increase time)     General bed mobility comments: up in chair  Transfers Overall transfer level: Needs assistance Equipment used: 1 person hand held assist Transfers: Sit to/from Stand Sit to Stand: Min assist         General transfer comment: steadying assist, extra time and effort with pt reporting feeling unsteady    Balance Overall balance assessment: Needs assistance         Standing balance support: During functional activity Standing balance-Leahy Scale: Poor Standing balance comment: external support in standing                           ADL either performed or assessed with clinical judgement   ADL Overall ADL's : Needs assistance/impaired Eating/Feeding:  Set up;Sitting   Grooming: Minimal assistance;Sitting   Upper Body Bathing: Moderate assistance;Sitting   Lower Body Bathing: Moderate assistance;Sit to/from stand   Upper Body Dressing : Moderate assistance;Sitting   Lower Body Dressing: Moderate assistance;Sit to/from stand   Toilet Transfer: Ambulation;Minimal assistance Toilet Transfer Details (indicate cue type and reason): pushing IV pole, steadying assist at times. Toileting- Clothing Manipulation and Hygiene: Minimal assistance;Sit to/from stand   Tub/ Shower Transfer: Ambulation;Moderate assistance;Shower seat   Functional mobility during ADLs: Minimal assistance(pushed IV pole) General ADL Comments: Pt completed household distance functional mobility using IV pole for support and min A provided at times. Pt reports feeling unsteady.      Vision         Perception     Praxis      Pertinent Vitals/Pain Pain Assessment: Faces Faces Pain Scale: Hurts a little bit Pain Location: LUE, grimacing at end points of elbow ROM Pain Descriptors / Indicators: Sore Pain Intervention(s): Limited activity within patient's tolerance;Monitored during session;Repositioned     Hand Dominance Right   Extremity/Trunk Assessment Upper Extremity Assessment Upper Extremity Assessment: LUE deficits/detail LUE Deficits / Details: s/p left shoulder hardware removal, conversion to Reverse shoulder arthroplasty on 09/29/18 with expected deficits   Lower Extremity Assessment Lower Extremity Assessment: Defer to PT evaluation   Cervical / Trunk Assessment Cervical / Trunk Assessment: Kyphotic   Communication Communication Communication: No difficulties   Cognition Arousal/Alertness: Awake/alert Behavior During Therapy: WFL for tasks assessed/performed Overall Cognitive Status: Within Functional Limits for tasks assessed  General Comments  Daughter in law present and included in  session.    Exercises Exercises: Other exercises Other Exercises Other Exercises: e/w/h exercises to toleration   Shoulder Instructions      Home Living Family/patient expects to be discharged to:: Other (Comment)(independent living) Living Arrangements: Alone                               Additional Comments: pt lives at [facility] independent living      Prior Functioning/Environment Level of Independence: Independent                 OT Problem List: Impaired balance (sitting and/or standing);Decreased knowledge of precautions;Decreased knowledge of use of DME or AE;Pain;Impaired UE functional use      OT Treatment/Interventions: Self-care/ADL training;Therapeutic exercise;DME and/or AE instruction;Therapeutic activities;Patient/family education;Balance training    OT Goals(Current goals can be found in the care plan section) Acute Rehab OT Goals Patient Stated Goal: feel better OT Goal Formulation: With patient/family Time For Goal Achievement: 10/07/18 Potential to Achieve Goals: Good ADL Goals Pt Will Perform Upper Body Bathing: with set-up;sitting Pt Will Perform Lower Body Bathing: with min guard assist;sit to/from stand Pt Will Perform Upper Body Dressing: with set-up;sitting Pt Will Perform Lower Body Dressing: with min guard assist;sit to/from stand Pt Will Transfer to Toilet: with supervision;ambulating Pt Will Perform Toileting - Clothing Manipulation and hygiene: with supervision;sit to/from stand Pt/caregiver will Perform Home Exercise Program: Left upper extremity;With written HEP provided;With Supervision  OT Frequency: Min 3X/week   Barriers to D/C:            Co-evaluation              AM-PAC PT "6 Clicks" Daily Activity     Outcome Measure Help from another person eating meals?: None Help from another person taking care of personal grooming?: A Little Help from another person toileting, which includes using toliet, bedpan,  or urinal?: A Lot Help from another person bathing (including washing, rinsing, drying)?: A Lot Help from another person to put on and taking off regular upper body clothing?: A Lot Help from another person to put on and taking off regular lower body clothing?: A Lot 6 Click Score: 15   End of Session Equipment Utilized During Treatment: Other (comment)(sling)  Activity Tolerance: Patient tolerated treatment well Patient left: in chair;with call bell/phone within reach;with family/visitor present  OT Visit Diagnosis: Unsteadiness on feet (R26.81);Pain Pain - Right/Left: Left Pain - part of body: Shoulder                Time: 6734-1937 OT Time Calculation (min): 30 min Charges:  OT General Charges $OT Visit: 1 Visit OT Evaluation $OT Eval Low Complexity: 1 Low OT Treatments $Self Care/Home Management : 8-22 mins  Tyrone Schimke, OT Acute Rehabilitation Services Pager: [phone] Office: [phone]   Hortencia Pilar 09/30/2018, 1:21 PM

## 2018-09-30 NOTE — Clinical Social Work Note (Addendum)
Clinical Social Work Assessment  Patient Details  Name: Valerie Molina MRN: [id number] Date of Birth: 07-28-1926  Date of referral:  09/30/18               Reason for consult:  Facility Placement                Permission sought to share information with:  Facility Sport and exercise psychologist, Family Supports Permission granted to share information::  Yes, Verbal Permission Granted  Name::       Valerie Molina (daughter in Sports coach)  Agency::   SNFs in [place].   Housing/Transportation Living arrangements for the past 2 months:  [place] of Information:  Patient, Adult Children Patient Interpreter Needed:  None Criminal Activity/Legal Involvement Pertinent to Current Situation/Hospitalization:  No - Comment as needed Significant Relationships:  Adult Children, Other Family Members, Community Support Lives with:  Facility Resident, Self Do you feel safe going back to the place where you live?  Yes Need for family participation in patient care:  Yes (Comment)  Care giving concerns:  Pt requiring assistance with ADLs and IADLs. PT and attending physician team requesting SNF referral for pt, pt amenable. Pt from ILF but feels she would be better managed at SNF before returning home. Pt previously had been to SNF in [facility].    Facilities manager / plan:  CSW met with pt and pt family at bedside, pt daughter in law and son present. Pt states that she had a shoulder surgery previously in [place] and went to rehab there. Since moving to [facility] she has not been to a SNF and resides at [facility]. Pt says that she likes where she lives and has good support from her community and friends. Pt children will support pt with decision making for SNF placement and they will come to a decision tomorrow. Pt understands that she will need a qualifying stay prior to discharge- this will be Sunday.   CSW followed up with pt family- their preferences are for [facility]  and [facility], both have offered. Will let CSW know final decision tomorrow.   Employment status:  Retired Nurse, adult PT Recommendations:  [place], [place] / Referral to community resources:  [place]  Patient/Family's Response to care:  Pt amenable to [place] visit, pt and pt family involved with care decisions and function well making decisions together. Pt independent and friendly. Looks forward to rehab and then returning back to ILF.   Patient/Family's Understanding of and Emotional Response to Diagnosis, Current Treatment, and Prognosis:  Pt states understanding of diagnosis, current treatment and prognosis. Pt eager to rehab and return home to [place]. Both pt and pt family friendly, motivated and engaged in care decisions. Pt and pt family emotionally appropriate throughout assessment and express reasonable expectations for pt moving forward.   Emotional Assessment Appearance:  Appears stated age Attitude/Demeanor/Rapport:  Engaged, Charismatic, Gracious Affect (typically observed):  Accepting, Adaptable, Appropriate, Pleasant Orientation:  Oriented to Self, Oriented to Place, Oriented to  Time, Oriented to Situation Alcohol / Substance use:  Not Applicable Psych involvement (Current and /or in the community):  No (Comment)  Discharge Needs  Concerns to be addressed:  Care Coordination Readmission within the last 30 days:  No Current discharge risk:  Physical Impairment, Lives alone Barriers to Discharge:  Ship broker, Continued Medical Work up   [employer], [place] 09/30/2018, 12:11 PM

## 2018-09-30 NOTE — Discharge Summary (Addendum)
PATIENT ID:      Valerie Molina  MRN:     [id number] DOB/AGE:    07-13-1926 / 84 y.o.     DISCHARGE SUMMARY  ADMISSION DATE:    09/29/2018 DISCHARGE DATE:  10/02/2018  ADMISSION DIAGNOSIS: left shoulder proximal humeral non-union Past Medical History:  Diagnosis Date  . Dysrhythmia   . Hypertension   . PONV (postoperative nausea and vomiting)     DISCHARGE DIAGNOSIS:   Active Problems:   S/p reverse total shoulder arthroplasty   PROCEDURE: Procedure(s): left shoulder hardware removal, conversion to Reverse shoulder arthroplasty on 09/29/2018  CONSULTS:    HISTORY:  See H&P in chart.  HOSPITAL COURSE:  Valerie Molina is a 84 y.o. admitted on 09/29/2018 with a diagnosis of left shoulder proximal humeral non-union.  They were brought to the operating room on 09/29/2018 and underwent Procedure(s): left shoulder hardware removal, conversion to Reverse shoulder arthroplasty.    They were given perioperative antibiotics:  Anti-infectives (From admission, onward)   Start     Dose/Rate Route Frequency Ordered Stop   09/29/18 0937  vancomycin (VANCOCIN) powder  Status:  Discontinued       As needed 09/29/18 0938 09/29/18 1000   09/29/18 0600  ceFAZolin (ANCEF) IVPB 2g/100 mL premix     2 g 200 mL/hr over 30 Minutes Intravenous On call to O.R. 09/29/18 0544 09/29/18 0800   09/29/18 0550  ceFAZolin (ANCEF) 2-4 GM/100ML-% IVPB    Note to Pharmacy:  Valerie Molina   : cabinet override      09/29/18 0550 09/29/18 0750    .  Patient underwent the above named procedure and tolerated it well. The following day they were hemodynamically stable and pain was controlled on oral analgesics. They were neurovascularly intact to the operative extremity. OT/PT  was ordered and worked with patient per protocol. The patient and family and therapy thought she could benefit from short term skilled so social work was consulted for bed search. They were medically and orthopaedically stable for  discharge on  .    DIAGNOSTIC STUDIES:  RECENT RADIOGRAPHIC STUDIES :  No results found.  RECENT VITAL SIGNS:   Patient Vitals for the past 24 hrs:  BP Temp Temp src Pulse Resp SpO2  09/30/18 1303 (!) 116/47 97.6 F (36.4 C) Oral 67 - 97 %  09/30/18 0511 (!) 128/50 97.9 F (36.6 C) Oral 70 16 96 %  09/30/18 0221 (!) 108/53 98.3 F (36.8 C) Oral 63 16 95 %  09/29/18 2257 (!) 101/49 98.7 F (37.1 C) Oral 78 16 94 %  .   DISCHARGE INSTRUCTIONS:    DISCHARGE MEDICATIONS:   Allergies as of 09/30/2018   No Known Allergies     Medication List    TAKE these medications   acetaminophen 325 MG tablet Commonly known as:  TYLENOL Take 1-2 tablets (325-650 mg total) by mouth every 6 (six) hours as needed for mild pain (pain score 1-3 or temp > 100.5).   amLODipine 5 MG tablet Commonly known as:  NORVASC Take 5 mg by mouth daily.   dorzolamide-timolol 22.3-6.8 MG/ML ophthalmic solution Commonly known as:  COSOPT Place 1 drop into both eyes 2 (two) times daily.   latanoprost 0.005 % ophthalmic solution Commonly known as:  XALATAN Place 1 drop into both eyes at bedtime.   levothyroxine 50 MCG tablet Commonly known as:  SYNTHROID, LEVOTHROID Take 50 mcg by mouth daily before breakfast.   losartan 50 MG tablet Commonly known  as:  COZAAR Take 50 mg by mouth daily.   RHOPRESSA 0.02 % Soln Generic drug:  Netarsudil Dimesylate Place 1 drop into both eyes daily.   spironolactone 25 MG tablet Commonly known as:  ALDACTONE Take 25 mg by mouth daily.   traMADol 50 MG tablet Commonly known as:  ULTRAM Take 1 tablet (50 mg total) by mouth every 6 (six) hours as needed (prn mild mod pain, pain score 4-6).   vitamin B-12 500 MCG tablet Commonly known as:  CYANOCOBALAMIN Take 500 mcg by mouth daily.       FOLLOW UP VISIT:   Follow-up Information    Justice Britain, MD.   Specialty:  Orthopedic Surgery Why:  call to be seen in 10-14 days Contact information: [address] [place] [address] [phone]           DISCHARGE TO: Skilled  Leave aquacel dressing in place  Cont PT for gait training  Cont OT for reverse total shoulder protocol: If sitting in controlled environment, ok to come out of sling to give neck a break. Please sleep in it to protect until follow up in office. May remove waist strap altogether  OK to use operative arm for feeding, hygiene and ADLs. Ok to instruct Pendulums and lap slides as exercises. Ok to use operative arm within the following parameters for ADL purposes  New ROM (8/18) Ok for PROM,  within pain tolerance and within the following ROM ER 20 ABD 45 FE 60       DISCHARGE CONDITION:  Thereasa Parkin Shuford for Dr. Justice Britain 09/30/2018, 1:31 PM

## 2018-10-01 NOTE — Social Work (Addendum)
CSW spoke with pt daughter Santiago Glad, pt and pt family would like to accept placement at [facility]. CSW has reached out to admissions liaison with choice. Await return answer.  4:26pm- [facility] has a SNF bed, pt able to d/c tomorrow. Pt daughter in law and pt aware, they will transport pt in personal car.   Westley Hummer, MSW, [place] Work [phone]

## 2018-10-01 NOTE — Progress Notes (Signed)
Subjective: 2 Days Post-Op Procedure(s) (LRB): left shoulder hardware removal, conversion to Reverse shoulder arthroplasty (Left) Patient reports pain as moderate.  Tolerating diet.  No n/v.  Objective: Vital signs in last 24 hours: Temp:  [97.6 F (36.4 C)-98.4 F (36.9 C)] 98.1 F (36.7 C) (10/26 0500) Pulse Rate:  [67-75] 75 (10/26 0500) Resp:  [14] 14 (10/26 0500) BP: (114-126)/(46-47) 126/46 (10/26 0500) SpO2:  [92 %-97 %] 94 % (10/26 0500)  Intake/Output from previous day: 10/25 0701 - 10/26 0700 In: 240 [P.O.:240] Out: -  Intake/Output this shift: No intake/output data recorded.  No results for input(s): HGB in the last 72 hours. No results for input(s): WBC, RBC, HCT, PLT in the last 72 hours. No results for input(s): NA, K, CL, CO2, BUN, CREATININE, GLUCOSE, CALCIUM in the last 72 hours. No results for input(s): LABPT, INR in the last 72 hours.  WN WD woman in nad.  L shoulder wound dressed and dry.  NVI at L UE.    Assessment/Plan: 2 Days Post-Op Procedure(s) (LRB): left shoulder hardware removal, conversion to Reverse shoulder arthroplasty (Left) Up with therapy  SNF placement pending.    Wylene Simmer 10/01/2018, 8:42 AM

## 2018-10-02 DIAGNOSIS — Z96612 Presence of left artificial shoulder joint: Secondary | ICD-10-CM | POA: Diagnosis not present

## 2018-10-02 DIAGNOSIS — E039 Hypothyroidism, unspecified: Secondary | ICD-10-CM | POA: Diagnosis not present

## 2018-10-02 DIAGNOSIS — M6281 Muscle weakness (generalized): Secondary | ICD-10-CM | POA: Diagnosis not present

## 2018-10-02 DIAGNOSIS — R278 Other lack of coordination: Secondary | ICD-10-CM | POA: Diagnosis not present

## 2018-10-02 DIAGNOSIS — I10 Essential (primary) hypertension: Secondary | ICD-10-CM | POA: Diagnosis not present

## 2018-10-02 DIAGNOSIS — R2681 Unsteadiness on feet: Secondary | ICD-10-CM | POA: Diagnosis not present

## 2018-10-02 DIAGNOSIS — M25512 Pain in left shoulder: Secondary | ICD-10-CM | POA: Diagnosis not present

## 2018-10-02 DIAGNOSIS — Z471 Aftercare following joint replacement surgery: Secondary | ICD-10-CM | POA: Diagnosis not present

## 2018-10-02 DIAGNOSIS — Z4789 Encounter for other orthopedic aftercare: Secondary | ICD-10-CM | POA: Diagnosis not present

## 2018-10-02 DIAGNOSIS — H409 Unspecified glaucoma: Secondary | ICD-10-CM | POA: Diagnosis not present

## 2018-10-02 NOTE — Progress Notes (Signed)
Valerie Molina to be D/C'd  per MD order. Discussed with the patient and all questions fully answered.  VSS, Skin clean, dry and intact without evidence of skin break down, no evidence of skin tears noted.  IV catheter discontinued intact. Site without signs and symptoms of complications. Dressing and pressure applied.  An After Visit Summary was printed and printed prescription given to the patients daughter in law Valerie Molina.   D/c education completed with patient/family including follow up instructions, medication list, d/c activities limitations if indicated, with other d/c instructions as indicated by MD - patient able to verbalize understanding, all questions fully answered.   Patient instructed to return to ED, call 911, or call MD for any changes in condition.   Patient to be escorted via [place], and D/C to [place] via private vehicle with Valerie Molina.   Report called and given to [facility] [place] for room 104.

## 2018-10-02 NOTE — Progress Notes (Signed)
Occupational Therapy Treatment Patient Details Name: Valerie Molina MRN: [id number] DOB: 07-16-1926 Today's Date: 10/02/2018    History of present illness pt is s/p left shoulder hardware removal, conversion to Reverse shoulder arthroplasty on 09/29/18   OT comments  Pt progressing towards OT goals this session, reviewed handout, and exercises with focus on pendulums. Pt is eager to get to rehab and really wants to be able to do her own hair, drive, and get things out of her closet. This patient is a [employer], and it was my pleasure to work with her during her hospital stay.   Follow Up Recommendations  SNF    Equipment Recommendations  Other (comment)(defer to next venue)    Recommendations for Other Services      Precautions / Restrictions Precautions Precautions: Fall;Shoulder Type of Shoulder Precautions: passive protocol Shoulder Interventions: Shoulder sling/immobilizer;At all times;Off for dressing/bathing/exercises Precaution Booklet Issued: Yes (comment) Precaution Comments: reviewed handout in full Required Braces or Orthoses: Sling Restrictions Weight Bearing Restrictions: Yes LUE Weight Bearing: Non weight bearing       Mobility Bed Mobility               General bed mobility comments: up in chair at beginning and end of session  Transfers Overall transfer level: Needs assistance Equipment used: 1 person hand held assist Transfers: Sit to/from Stand Sit to Stand: Min assist         General transfer comment: asisst for balance, and slight boost    Balance Overall balance assessment: Needs assistance Sitting-balance support: Single extremity supported;No upper extremity supported;Feet supported Sitting balance-Leahy Scale: Good     Standing balance support: During functional activity Standing balance-Leahy Scale: Poor Standing balance comment: external support in standing                           ADL either performed or  assessed with clinical judgement   ADL                                         General ADL Comments: please see shoulder section below     Vision       Perception     Praxis      Cognition Arousal/Alertness: Awake/alert Behavior During Therapy: WFL for tasks assessed/performed Overall Cognitive Status: Within Functional Limits for tasks assessed                                          Exercises Exercises: Shoulder Shoulder Exercises Pendulum Exercise: PROM;Left;Seated;Standing(educated in gentle pendulums, limited by pain today) Shoulder Flexion: AROM;PROM;Self ROM;Left(educated to 60 degrees for functional ADL) Shoulder ABduction: PROM;AROM;Self ROM;Left;Other (comment)(educated to 45 degrees for functional ADL) Shoulder External Rotation: PROM;AROM;Self ROM;Left;Other (comment)(educated to 20 degrees for functional ADL) Elbow Flexion: AROM;Left Elbow Extension: AROM;Left Wrist Flexion: AROM;Left Wrist Extension: AROM;Left Digit Composite Flexion: AROM;Left Composite Extension: AROM;Left Neck Flexion: AROM Neck Extension: AROM Neck Lateral Flexion - Right: AROM Neck Lateral Flexion - Left: AROM   Shoulder Instructions Shoulder Instructions Donning/doffing shirt without moving shoulder: Maximal assistance;Patient able to independently direct caregiver Method for sponge bathing under operated UE: Maximal assistance Donning/doffing sling/immobilizer: Maximal assistance;Patient able to independently direct caregiver Correct positioning of sling/immobilizer: Independent Pendulum exercises (written home exercise program): Supervision/safety;Patient able to  independently direct caregiver(limited due to pain today) ROM for elbow, wrist and digits of operated UE: Independent Sling wearing schedule (on at all times/off for ADL's): Independent Proper positioning of operated UE when showering: Independent Positioning of UE while sleeping:  Independent     General Comments      Pertinent Vitals/ Pain       Pain Assessment: 0-10 Pain Score: 4  Pain Location: LUE, grimacing at end points of elbow ROM Pain Descriptors / Indicators: Discomfort;Sore;Grimacing Pain Intervention(s): Limited activity within patient's tolerance;Monitored during session;Repositioned(declined ice)  Home Living                                          Prior Functioning/Environment              Frequency  Min 3X/week        Progress Toward Goals  OT Goals(current goals can now be found in the care plan section)  Progress towards OT goals: Progressing toward goals  Acute Rehab OT Goals Patient Stated Goal: be able to do my hair, drive, and reach things in my closet OT Goal Formulation: With patient/family Time For Goal Achievement: 10/07/18 Potential to Achieve Goals: Good  Plan Discharge plan remains appropriate;Frequency remains appropriate    Co-evaluation                 AM-PAC PT "6 Clicks" Daily Activity     Outcome Measure   Help from another person eating meals?: None Help from another person taking care of personal grooming?: A Little Help from another person toileting, which includes using toliet, bedpan, or urinal?: A Lot Help from another person bathing (including washing, rinsing, drying)?: A Lot Help from another person to put on and taking off regular upper body clothing?: A Lot Help from another person to put on and taking off regular lower body clothing?: A Lot 6 Click Score: 15    End of Session Equipment Utilized During Treatment: Other (comment);Gait belt(sling)  OT Visit Diagnosis: Unsteadiness on feet (R26.81);Pain Pain - Right/Left: Left Pain - part of body: Shoulder   Activity Tolerance Patient tolerated treatment well   Patient Left in chair;with call bell/phone within reach;Other (comment)(with fresh decaf coffee)   Nurse Communication Mobility status;Weight bearing  status        Time: 1001-1035 OT Time Calculation (min): 34 min  Charges: OT General Charges $OT Visit: 1 Visit OT Treatments $Self Care/Home Management : 8-22 mins $Therapeutic Exercise: 8-22 mins  Hulda Humphrey OTR/L Acute Rehabilitation Services Pager: [phone] Office: [place] 10/02/2018, 11:14 AM

## 2018-10-02 NOTE — Progress Notes (Signed)
   Subjective: 3 Days Post-Op Procedure(s) (LRB): left shoulder hardware removal, conversion to Reverse shoulder arthroplasty (Left) Patient reports pain as mild.   Patient seen in rounds for Dr. Onnie Graham Patient is well, and has had no acute complaints or problems other than some discomfort in her left arm. No SOB or chest pain. Voiding well. Positive flatus.  Plan is to go Skilled nursing facility after hospital stay.  Objective: Vital signs in last 24 hours: Temp:  [98.4 F (36.9 C)-98.9 F (37.2 C)] 98.4 F (36.9 C) (10/27 0530) Pulse Rate:  [72-82] 76 (10/27 0530) Resp:  [16-18] 16 (10/27 0530) BP: (119-165)/(48-79) 119/54 (10/27 0530) SpO2:  [97 %-98 %] 97 % (10/27 0530)  Intake/Output from previous day:  Intake/Output Summary (Last 24 hours) at 10/02/2018 0832 Last data filed at 10/02/2018 0535 Gross per 24 hour  Intake 780 ml  Output -  Net 780 ml     EXAM General - Patient is Alert and Oriented Extremity - Neurologically intact Intact pulses distally No cellulitis present Compartment soft Dressing/Incision - clean, dry, no drainage Motor Function - intact, moving hand and fingers well on exam  Past Medical History:  Diagnosis Date  . Dysrhythmia   . Hypertension   . PONV (postoperative nausea and vomiting)     Assessment/Plan: 3 Days Post-Op Procedure(s) (LRB): left shoulder hardware removal, conversion to Reverse shoulder arthroplasty (Left) Active Problems:   S/p reverse total shoulder arthroplasty  Estimated body mass index is 24.79 kg/m as calculated from the following:   Height as of 09/22/18: 5\' 6"  (1.676 m).   Weight as of 09/22/18: 69.7 kg. Advance diet  Continue using sling. Plan for DC to SNF today. Follow up in office per Dr. Onnie Graham.   Ardeen Jourdain, PA-C Orthopaedic Surgery 10/02/2018, 8:32 AM

## 2018-10-02 NOTE — Progress Notes (Addendum)
Clinical Social Worker facilitated patient discharge including contacting patient family and facility to confirm patient discharge plans.  Clinical information faxed to facility and family agreeable with plan.  Family will transport pt to facility.RN to call for report prior to discharge [phone] room 104  Clinical Social Worker will sign off for now as social work intervention is no longer needed. Please consult [place] again if new need arises.  Mahamad Danielly Ackerley LCSW [phone]

## 2018-10-07 DIAGNOSIS — I10 Essential (primary) hypertension: Secondary | ICD-10-CM | POA: Diagnosis not present

## 2018-10-07 DIAGNOSIS — Z96612 Presence of left artificial shoulder joint: Secondary | ICD-10-CM | POA: Diagnosis not present

## 2018-10-12 DIAGNOSIS — Z471 Aftercare following joint replacement surgery: Secondary | ICD-10-CM | POA: Diagnosis not present

## 2018-10-12 DIAGNOSIS — Z4789 Encounter for other orthopedic aftercare: Secondary | ICD-10-CM | POA: Diagnosis not present

## 2018-10-17 DIAGNOSIS — S42202D Unspecified fracture of upper end of left humerus, subsequent encounter for fracture with routine healing: Secondary | ICD-10-CM | POA: Diagnosis not present

## 2018-10-17 DIAGNOSIS — Z96612 Presence of left artificial shoulder joint: Secondary | ICD-10-CM | POA: Diagnosis not present

## 2018-10-17 DIAGNOSIS — I10 Essential (primary) hypertension: Secondary | ICD-10-CM | POA: Diagnosis not present

## 2018-10-17 DIAGNOSIS — E039 Hypothyroidism, unspecified: Secondary | ICD-10-CM | POA: Diagnosis not present

## 2018-10-17 DIAGNOSIS — H409 Unspecified glaucoma: Secondary | ICD-10-CM | POA: Diagnosis not present

## 2018-10-19 DIAGNOSIS — M96622 Fracture of humerus following insertion of orthopedic implant, joint prosthesis, or bone plate, left arm: Secondary | ICD-10-CM | POA: Diagnosis not present

## 2018-10-19 DIAGNOSIS — I10 Essential (primary) hypertension: Secondary | ICD-10-CM | POA: Diagnosis not present

## 2018-10-19 DIAGNOSIS — Z96612 Presence of left artificial shoulder joint: Secondary | ICD-10-CM | POA: Diagnosis not present

## 2018-10-19 DIAGNOSIS — N183 Chronic kidney disease, stage 3 (moderate): Secondary | ICD-10-CM | POA: Diagnosis not present

## 2018-10-19 DIAGNOSIS — Z6824 Body mass index (BMI) 24.0-24.9, adult: Secondary | ICD-10-CM | POA: Diagnosis not present

## 2018-10-19 DIAGNOSIS — E039 Hypothyroidism, unspecified: Secondary | ICD-10-CM | POA: Diagnosis not present

## 2018-10-19 DIAGNOSIS — H409 Unspecified glaucoma: Secondary | ICD-10-CM | POA: Diagnosis not present

## 2018-10-19 DIAGNOSIS — Z96619 Presence of unspecified artificial shoulder joint: Secondary | ICD-10-CM | POA: Diagnosis not present

## 2018-10-19 DIAGNOSIS — S42202D Unspecified fracture of upper end of left humerus, subsequent encounter for fracture with routine healing: Secondary | ICD-10-CM | POA: Diagnosis not present

## 2018-10-19 DIAGNOSIS — H2589 Other age-related cataract: Secondary | ICD-10-CM | POA: Diagnosis not present

## 2018-10-20 DIAGNOSIS — H409 Unspecified glaucoma: Secondary | ICD-10-CM | POA: Diagnosis not present

## 2018-10-20 DIAGNOSIS — Z96612 Presence of left artificial shoulder joint: Secondary | ICD-10-CM | POA: Diagnosis not present

## 2018-10-20 DIAGNOSIS — E039 Hypothyroidism, unspecified: Secondary | ICD-10-CM | POA: Diagnosis not present

## 2018-10-20 DIAGNOSIS — S42202D Unspecified fracture of upper end of left humerus, subsequent encounter for fracture with routine healing: Secondary | ICD-10-CM | POA: Diagnosis not present

## 2018-10-20 DIAGNOSIS — I10 Essential (primary) hypertension: Secondary | ICD-10-CM | POA: Diagnosis not present

## 2018-10-21 DIAGNOSIS — S42202D Unspecified fracture of upper end of left humerus, subsequent encounter for fracture with routine healing: Secondary | ICD-10-CM | POA: Diagnosis not present

## 2018-10-21 DIAGNOSIS — Z96612 Presence of left artificial shoulder joint: Secondary | ICD-10-CM | POA: Diagnosis not present

## 2018-10-21 DIAGNOSIS — I10 Essential (primary) hypertension: Secondary | ICD-10-CM | POA: Diagnosis not present

## 2018-10-21 DIAGNOSIS — H409 Unspecified glaucoma: Secondary | ICD-10-CM | POA: Diagnosis not present

## 2018-10-21 DIAGNOSIS — E039 Hypothyroidism, unspecified: Secondary | ICD-10-CM | POA: Diagnosis not present

## 2018-10-24 DIAGNOSIS — H409 Unspecified glaucoma: Secondary | ICD-10-CM | POA: Diagnosis not present

## 2018-10-24 DIAGNOSIS — S42202D Unspecified fracture of upper end of left humerus, subsequent encounter for fracture with routine healing: Secondary | ICD-10-CM | POA: Diagnosis not present

## 2018-10-24 DIAGNOSIS — I10 Essential (primary) hypertension: Secondary | ICD-10-CM | POA: Diagnosis not present

## 2018-10-24 DIAGNOSIS — E039 Hypothyroidism, unspecified: Secondary | ICD-10-CM | POA: Diagnosis not present

## 2018-10-24 DIAGNOSIS — Z96612 Presence of left artificial shoulder joint: Secondary | ICD-10-CM | POA: Diagnosis not present

## 2018-10-25 DIAGNOSIS — E039 Hypothyroidism, unspecified: Secondary | ICD-10-CM | POA: Diagnosis not present

## 2018-10-25 DIAGNOSIS — Z96612 Presence of left artificial shoulder joint: Secondary | ICD-10-CM | POA: Diagnosis not present

## 2018-10-25 DIAGNOSIS — H409 Unspecified glaucoma: Secondary | ICD-10-CM | POA: Diagnosis not present

## 2018-10-25 DIAGNOSIS — I10 Essential (primary) hypertension: Secondary | ICD-10-CM | POA: Diagnosis not present

## 2018-10-25 DIAGNOSIS — S42202D Unspecified fracture of upper end of left humerus, subsequent encounter for fracture with routine healing: Secondary | ICD-10-CM | POA: Diagnosis not present

## 2018-10-27 DIAGNOSIS — H409 Unspecified glaucoma: Secondary | ICD-10-CM | POA: Diagnosis not present

## 2018-10-27 DIAGNOSIS — S42202D Unspecified fracture of upper end of left humerus, subsequent encounter for fracture with routine healing: Secondary | ICD-10-CM | POA: Diagnosis not present

## 2018-10-27 DIAGNOSIS — Z96612 Presence of left artificial shoulder joint: Secondary | ICD-10-CM | POA: Diagnosis not present

## 2018-10-27 DIAGNOSIS — I10 Essential (primary) hypertension: Secondary | ICD-10-CM | POA: Diagnosis not present

## 2018-10-27 DIAGNOSIS — E039 Hypothyroidism, unspecified: Secondary | ICD-10-CM | POA: Diagnosis not present

## 2018-10-31 DIAGNOSIS — E039 Hypothyroidism, unspecified: Secondary | ICD-10-CM | POA: Diagnosis not present

## 2018-10-31 DIAGNOSIS — Z96612 Presence of left artificial shoulder joint: Secondary | ICD-10-CM | POA: Diagnosis not present

## 2018-10-31 DIAGNOSIS — H409 Unspecified glaucoma: Secondary | ICD-10-CM | POA: Diagnosis not present

## 2018-10-31 DIAGNOSIS — I10 Essential (primary) hypertension: Secondary | ICD-10-CM | POA: Diagnosis not present

## 2018-10-31 DIAGNOSIS — S42202D Unspecified fracture of upper end of left humerus, subsequent encounter for fracture with routine healing: Secondary | ICD-10-CM | POA: Diagnosis not present

## 2018-11-01 DIAGNOSIS — H409 Unspecified glaucoma: Secondary | ICD-10-CM | POA: Diagnosis not present

## 2018-11-01 DIAGNOSIS — E039 Hypothyroidism, unspecified: Secondary | ICD-10-CM | POA: Diagnosis not present

## 2018-11-01 DIAGNOSIS — I10 Essential (primary) hypertension: Secondary | ICD-10-CM | POA: Diagnosis not present

## 2018-11-01 DIAGNOSIS — S42202D Unspecified fracture of upper end of left humerus, subsequent encounter for fracture with routine healing: Secondary | ICD-10-CM | POA: Diagnosis not present

## 2018-11-01 DIAGNOSIS — Z96612 Presence of left artificial shoulder joint: Secondary | ICD-10-CM | POA: Diagnosis not present

## 2018-11-02 DIAGNOSIS — Z96612 Presence of left artificial shoulder joint: Secondary | ICD-10-CM | POA: Diagnosis not present

## 2018-11-02 DIAGNOSIS — I10 Essential (primary) hypertension: Secondary | ICD-10-CM | POA: Diagnosis not present

## 2018-11-02 DIAGNOSIS — H409 Unspecified glaucoma: Secondary | ICD-10-CM | POA: Diagnosis not present

## 2018-11-02 DIAGNOSIS — S42202D Unspecified fracture of upper end of left humerus, subsequent encounter for fracture with routine healing: Secondary | ICD-10-CM | POA: Diagnosis not present

## 2018-11-02 DIAGNOSIS — E039 Hypothyroidism, unspecified: Secondary | ICD-10-CM | POA: Diagnosis not present

## 2018-11-07 DIAGNOSIS — Z96612 Presence of left artificial shoulder joint: Secondary | ICD-10-CM | POA: Diagnosis not present

## 2018-11-07 DIAGNOSIS — I10 Essential (primary) hypertension: Secondary | ICD-10-CM | POA: Diagnosis not present

## 2018-11-07 DIAGNOSIS — H409 Unspecified glaucoma: Secondary | ICD-10-CM | POA: Diagnosis not present

## 2018-11-07 DIAGNOSIS — E039 Hypothyroidism, unspecified: Secondary | ICD-10-CM | POA: Diagnosis not present

## 2018-11-07 DIAGNOSIS — S42202D Unspecified fracture of upper end of left humerus, subsequent encounter for fracture with routine healing: Secondary | ICD-10-CM | POA: Diagnosis not present

## 2018-11-08 DIAGNOSIS — E039 Hypothyroidism, unspecified: Secondary | ICD-10-CM | POA: Diagnosis not present

## 2018-11-08 DIAGNOSIS — S42202D Unspecified fracture of upper end of left humerus, subsequent encounter for fracture with routine healing: Secondary | ICD-10-CM | POA: Diagnosis not present

## 2018-11-08 DIAGNOSIS — I10 Essential (primary) hypertension: Secondary | ICD-10-CM | POA: Diagnosis not present

## 2018-11-08 DIAGNOSIS — H409 Unspecified glaucoma: Secondary | ICD-10-CM | POA: Diagnosis not present

## 2018-11-08 DIAGNOSIS — Z96612 Presence of left artificial shoulder joint: Secondary | ICD-10-CM | POA: Diagnosis not present

## 2018-11-09 DIAGNOSIS — H409 Unspecified glaucoma: Secondary | ICD-10-CM | POA: Diagnosis not present

## 2018-11-09 DIAGNOSIS — I10 Essential (primary) hypertension: Secondary | ICD-10-CM | POA: Diagnosis not present

## 2018-11-09 DIAGNOSIS — S42202D Unspecified fracture of upper end of left humerus, subsequent encounter for fracture with routine healing: Secondary | ICD-10-CM | POA: Diagnosis not present

## 2018-11-09 DIAGNOSIS — E039 Hypothyroidism, unspecified: Secondary | ICD-10-CM | POA: Diagnosis not present

## 2018-11-09 DIAGNOSIS — Z96612 Presence of left artificial shoulder joint: Secondary | ICD-10-CM | POA: Diagnosis not present

## 2018-11-09 DIAGNOSIS — Z471 Aftercare following joint replacement surgery: Secondary | ICD-10-CM | POA: Diagnosis not present

## 2018-11-10 DIAGNOSIS — H409 Unspecified glaucoma: Secondary | ICD-10-CM | POA: Diagnosis not present

## 2018-11-10 DIAGNOSIS — E039 Hypothyroidism, unspecified: Secondary | ICD-10-CM | POA: Diagnosis not present

## 2018-11-10 DIAGNOSIS — S42202D Unspecified fracture of upper end of left humerus, subsequent encounter for fracture with routine healing: Secondary | ICD-10-CM | POA: Diagnosis not present

## 2018-11-10 DIAGNOSIS — I10 Essential (primary) hypertension: Secondary | ICD-10-CM | POA: Diagnosis not present

## 2018-11-10 DIAGNOSIS — Z96612 Presence of left artificial shoulder joint: Secondary | ICD-10-CM | POA: Diagnosis not present

## 2018-11-11 DIAGNOSIS — Z96612 Presence of left artificial shoulder joint: Secondary | ICD-10-CM | POA: Diagnosis not present

## 2018-11-11 DIAGNOSIS — S42202D Unspecified fracture of upper end of left humerus, subsequent encounter for fracture with routine healing: Secondary | ICD-10-CM | POA: Diagnosis not present

## 2018-11-11 DIAGNOSIS — I10 Essential (primary) hypertension: Secondary | ICD-10-CM | POA: Diagnosis not present

## 2018-11-11 DIAGNOSIS — E039 Hypothyroidism, unspecified: Secondary | ICD-10-CM | POA: Diagnosis not present

## 2018-11-11 DIAGNOSIS — H409 Unspecified glaucoma: Secondary | ICD-10-CM | POA: Diagnosis not present

## 2018-11-14 DIAGNOSIS — Z96612 Presence of left artificial shoulder joint: Secondary | ICD-10-CM | POA: Diagnosis not present

## 2018-11-14 DIAGNOSIS — E039 Hypothyroidism, unspecified: Secondary | ICD-10-CM | POA: Diagnosis not present

## 2018-11-14 DIAGNOSIS — S42202D Unspecified fracture of upper end of left humerus, subsequent encounter for fracture with routine healing: Secondary | ICD-10-CM | POA: Diagnosis not present

## 2018-11-14 DIAGNOSIS — H409 Unspecified glaucoma: Secondary | ICD-10-CM | POA: Diagnosis not present

## 2018-11-14 DIAGNOSIS — I10 Essential (primary) hypertension: Secondary | ICD-10-CM | POA: Diagnosis not present

## 2018-11-16 DIAGNOSIS — M6281 Muscle weakness (generalized): Secondary | ICD-10-CM | POA: Diagnosis not present

## 2018-11-16 DIAGNOSIS — R278 Other lack of coordination: Secondary | ICD-10-CM | POA: Diagnosis not present

## 2018-11-16 DIAGNOSIS — M25512 Pain in left shoulder: Secondary | ICD-10-CM | POA: Diagnosis not present

## 2018-11-17 DIAGNOSIS — M25512 Pain in left shoulder: Secondary | ICD-10-CM | POA: Diagnosis not present

## 2018-11-17 DIAGNOSIS — M6281 Muscle weakness (generalized): Secondary | ICD-10-CM | POA: Diagnosis not present

## 2018-11-17 DIAGNOSIS — R278 Other lack of coordination: Secondary | ICD-10-CM | POA: Diagnosis not present

## 2018-11-18 DIAGNOSIS — R278 Other lack of coordination: Secondary | ICD-10-CM | POA: Diagnosis not present

## 2018-11-18 DIAGNOSIS — M25512 Pain in left shoulder: Secondary | ICD-10-CM | POA: Diagnosis not present

## 2018-11-18 DIAGNOSIS — M6281 Muscle weakness (generalized): Secondary | ICD-10-CM | POA: Diagnosis not present

## 2018-11-21 DIAGNOSIS — R278 Other lack of coordination: Secondary | ICD-10-CM | POA: Diagnosis not present

## 2018-11-21 DIAGNOSIS — M6281 Muscle weakness (generalized): Secondary | ICD-10-CM | POA: Diagnosis not present

## 2018-11-21 DIAGNOSIS — M25512 Pain in left shoulder: Secondary | ICD-10-CM | POA: Diagnosis not present

## 2018-11-21 DIAGNOSIS — Z961 Presence of intraocular lens: Secondary | ICD-10-CM | POA: Diagnosis not present

## 2018-11-21 DIAGNOSIS — H401131 Primary open-angle glaucoma, bilateral, mild stage: Secondary | ICD-10-CM | POA: Diagnosis not present

## 2018-11-21 DIAGNOSIS — H43812 Vitreous degeneration, left eye: Secondary | ICD-10-CM | POA: Diagnosis not present

## 2018-11-23 DIAGNOSIS — M25512 Pain in left shoulder: Secondary | ICD-10-CM | POA: Diagnosis not present

## 2018-11-23 DIAGNOSIS — R278 Other lack of coordination: Secondary | ICD-10-CM | POA: Diagnosis not present

## 2018-11-23 DIAGNOSIS — M6281 Muscle weakness (generalized): Secondary | ICD-10-CM | POA: Diagnosis not present

## 2018-11-25 DIAGNOSIS — M25512 Pain in left shoulder: Secondary | ICD-10-CM | POA: Diagnosis not present

## 2018-11-25 DIAGNOSIS — R278 Other lack of coordination: Secondary | ICD-10-CM | POA: Diagnosis not present

## 2018-11-25 DIAGNOSIS — M6281 Muscle weakness (generalized): Secondary | ICD-10-CM | POA: Diagnosis not present

## 2018-11-28 DIAGNOSIS — M25512 Pain in left shoulder: Secondary | ICD-10-CM | POA: Diagnosis not present

## 2018-11-28 DIAGNOSIS — M6281 Muscle weakness (generalized): Secondary | ICD-10-CM | POA: Diagnosis not present

## 2018-11-28 DIAGNOSIS — R278 Other lack of coordination: Secondary | ICD-10-CM | POA: Diagnosis not present

## 2018-12-01 DIAGNOSIS — R278 Other lack of coordination: Secondary | ICD-10-CM | POA: Diagnosis not present

## 2018-12-01 DIAGNOSIS — M25512 Pain in left shoulder: Secondary | ICD-10-CM | POA: Diagnosis not present

## 2018-12-01 DIAGNOSIS — M6281 Muscle weakness (generalized): Secondary | ICD-10-CM | POA: Diagnosis not present

## 2018-12-02 DIAGNOSIS — R278 Other lack of coordination: Secondary | ICD-10-CM | POA: Diagnosis not present

## 2018-12-02 DIAGNOSIS — M25512 Pain in left shoulder: Secondary | ICD-10-CM | POA: Diagnosis not present

## 2018-12-02 DIAGNOSIS — M6281 Muscle weakness (generalized): Secondary | ICD-10-CM | POA: Diagnosis not present

## 2018-12-05 DIAGNOSIS — M25512 Pain in left shoulder: Secondary | ICD-10-CM | POA: Diagnosis not present

## 2018-12-05 DIAGNOSIS — M6281 Muscle weakness (generalized): Secondary | ICD-10-CM | POA: Diagnosis not present

## 2018-12-05 DIAGNOSIS — R278 Other lack of coordination: Secondary | ICD-10-CM | POA: Diagnosis not present

## 2018-12-06 DIAGNOSIS — M6281 Muscle weakness (generalized): Secondary | ICD-10-CM | POA: Diagnosis not present

## 2018-12-06 DIAGNOSIS — M25512 Pain in left shoulder: Secondary | ICD-10-CM | POA: Diagnosis not present

## 2018-12-06 DIAGNOSIS — R278 Other lack of coordination: Secondary | ICD-10-CM | POA: Diagnosis not present

## 2018-12-09 DIAGNOSIS — M6281 Muscle weakness (generalized): Secondary | ICD-10-CM | POA: Diagnosis not present

## 2018-12-09 DIAGNOSIS — R278 Other lack of coordination: Secondary | ICD-10-CM | POA: Diagnosis not present

## 2018-12-09 DIAGNOSIS — M25512 Pain in left shoulder: Secondary | ICD-10-CM | POA: Diagnosis not present

## 2018-12-12 DIAGNOSIS — M6281 Muscle weakness (generalized): Secondary | ICD-10-CM | POA: Diagnosis not present

## 2018-12-12 DIAGNOSIS — R278 Other lack of coordination: Secondary | ICD-10-CM | POA: Diagnosis not present

## 2018-12-12 DIAGNOSIS — M25512 Pain in left shoulder: Secondary | ICD-10-CM | POA: Diagnosis not present

## 2018-12-14 DIAGNOSIS — M25512 Pain in left shoulder: Secondary | ICD-10-CM | POA: Diagnosis not present

## 2018-12-14 DIAGNOSIS — M6281 Muscle weakness (generalized): Secondary | ICD-10-CM | POA: Diagnosis not present

## 2018-12-14 DIAGNOSIS — R278 Other lack of coordination: Secondary | ICD-10-CM | POA: Diagnosis not present

## 2018-12-16 DIAGNOSIS — M6281 Muscle weakness (generalized): Secondary | ICD-10-CM | POA: Diagnosis not present

## 2018-12-16 DIAGNOSIS — M25512 Pain in left shoulder: Secondary | ICD-10-CM | POA: Diagnosis not present

## 2018-12-16 DIAGNOSIS — R278 Other lack of coordination: Secondary | ICD-10-CM | POA: Diagnosis not present

## 2018-12-19 DIAGNOSIS — R278 Other lack of coordination: Secondary | ICD-10-CM | POA: Diagnosis not present

## 2018-12-19 DIAGNOSIS — M25512 Pain in left shoulder: Secondary | ICD-10-CM | POA: Diagnosis not present

## 2018-12-19 DIAGNOSIS — M6281 Muscle weakness (generalized): Secondary | ICD-10-CM | POA: Diagnosis not present

## 2018-12-21 DIAGNOSIS — M25512 Pain in left shoulder: Secondary | ICD-10-CM | POA: Diagnosis not present

## 2018-12-21 DIAGNOSIS — R278 Other lack of coordination: Secondary | ICD-10-CM | POA: Diagnosis not present

## 2018-12-21 DIAGNOSIS — Z96612 Presence of left artificial shoulder joint: Secondary | ICD-10-CM | POA: Diagnosis not present

## 2018-12-21 DIAGNOSIS — Z4789 Encounter for other orthopedic aftercare: Secondary | ICD-10-CM | POA: Diagnosis not present

## 2018-12-21 DIAGNOSIS — M6281 Muscle weakness (generalized): Secondary | ICD-10-CM | POA: Diagnosis not present

## 2018-12-23 DIAGNOSIS — M25512 Pain in left shoulder: Secondary | ICD-10-CM | POA: Diagnosis not present

## 2018-12-23 DIAGNOSIS — R278 Other lack of coordination: Secondary | ICD-10-CM | POA: Diagnosis not present

## 2018-12-23 DIAGNOSIS — M6281 Muscle weakness (generalized): Secondary | ICD-10-CM | POA: Diagnosis not present

## 2018-12-26 DIAGNOSIS — M6281 Muscle weakness (generalized): Secondary | ICD-10-CM | POA: Diagnosis not present

## 2018-12-26 DIAGNOSIS — R278 Other lack of coordination: Secondary | ICD-10-CM | POA: Diagnosis not present

## 2018-12-26 DIAGNOSIS — M25512 Pain in left shoulder: Secondary | ICD-10-CM | POA: Diagnosis not present

## 2018-12-28 DIAGNOSIS — R278 Other lack of coordination: Secondary | ICD-10-CM | POA: Diagnosis not present

## 2018-12-28 DIAGNOSIS — M6281 Muscle weakness (generalized): Secondary | ICD-10-CM | POA: Diagnosis not present

## 2018-12-28 DIAGNOSIS — M25512 Pain in left shoulder: Secondary | ICD-10-CM | POA: Diagnosis not present

## 2018-12-29 DIAGNOSIS — M25512 Pain in left shoulder: Secondary | ICD-10-CM | POA: Diagnosis not present

## 2018-12-29 DIAGNOSIS — M6281 Muscle weakness (generalized): Secondary | ICD-10-CM | POA: Diagnosis not present

## 2018-12-29 DIAGNOSIS — R278 Other lack of coordination: Secondary | ICD-10-CM | POA: Diagnosis not present

## 2019-01-02 DIAGNOSIS — M25512 Pain in left shoulder: Secondary | ICD-10-CM | POA: Diagnosis not present

## 2019-01-02 DIAGNOSIS — R278 Other lack of coordination: Secondary | ICD-10-CM | POA: Diagnosis not present

## 2019-01-02 DIAGNOSIS — M6281 Muscle weakness (generalized): Secondary | ICD-10-CM | POA: Diagnosis not present

## 2019-01-04 DIAGNOSIS — R278 Other lack of coordination: Secondary | ICD-10-CM | POA: Diagnosis not present

## 2019-01-04 DIAGNOSIS — M25512 Pain in left shoulder: Secondary | ICD-10-CM | POA: Diagnosis not present

## 2019-01-04 DIAGNOSIS — M6281 Muscle weakness (generalized): Secondary | ICD-10-CM | POA: Diagnosis not present

## 2019-01-06 DIAGNOSIS — M6281 Muscle weakness (generalized): Secondary | ICD-10-CM | POA: Diagnosis not present

## 2019-01-06 DIAGNOSIS — M25512 Pain in left shoulder: Secondary | ICD-10-CM | POA: Diagnosis not present

## 2019-01-06 DIAGNOSIS — R278 Other lack of coordination: Secondary | ICD-10-CM | POA: Diagnosis not present

## 2019-01-09 DIAGNOSIS — M6281 Muscle weakness (generalized): Secondary | ICD-10-CM | POA: Diagnosis not present

## 2019-01-09 DIAGNOSIS — R278 Other lack of coordination: Secondary | ICD-10-CM | POA: Diagnosis not present

## 2019-01-09 DIAGNOSIS — M25512 Pain in left shoulder: Secondary | ICD-10-CM | POA: Diagnosis not present

## 2019-01-11 DIAGNOSIS — M25512 Pain in left shoulder: Secondary | ICD-10-CM | POA: Diagnosis not present

## 2019-01-11 DIAGNOSIS — R278 Other lack of coordination: Secondary | ICD-10-CM | POA: Diagnosis not present

## 2019-01-11 DIAGNOSIS — M6281 Muscle weakness (generalized): Secondary | ICD-10-CM | POA: Diagnosis not present

## 2019-01-13 DIAGNOSIS — M6281 Muscle weakness (generalized): Secondary | ICD-10-CM | POA: Diagnosis not present

## 2019-01-13 DIAGNOSIS — R278 Other lack of coordination: Secondary | ICD-10-CM | POA: Diagnosis not present

## 2019-01-13 DIAGNOSIS — M25512 Pain in left shoulder: Secondary | ICD-10-CM | POA: Diagnosis not present

## 2019-01-16 DIAGNOSIS — R278 Other lack of coordination: Secondary | ICD-10-CM | POA: Diagnosis not present

## 2019-01-16 DIAGNOSIS — M6281 Muscle weakness (generalized): Secondary | ICD-10-CM | POA: Diagnosis not present

## 2019-01-16 DIAGNOSIS — M25512 Pain in left shoulder: Secondary | ICD-10-CM | POA: Diagnosis not present

## 2019-01-18 DIAGNOSIS — M6281 Muscle weakness (generalized): Secondary | ICD-10-CM | POA: Diagnosis not present

## 2019-01-18 DIAGNOSIS — R278 Other lack of coordination: Secondary | ICD-10-CM | POA: Diagnosis not present

## 2019-01-18 DIAGNOSIS — M25512 Pain in left shoulder: Secondary | ICD-10-CM | POA: Diagnosis not present

## 2019-01-20 DIAGNOSIS — M6281 Muscle weakness (generalized): Secondary | ICD-10-CM | POA: Diagnosis not present

## 2019-01-20 DIAGNOSIS — M25512 Pain in left shoulder: Secondary | ICD-10-CM | POA: Diagnosis not present

## 2019-01-20 DIAGNOSIS — R278 Other lack of coordination: Secondary | ICD-10-CM | POA: Diagnosis not present

## 2019-01-23 DIAGNOSIS — M25512 Pain in left shoulder: Secondary | ICD-10-CM | POA: Diagnosis not present

## 2019-01-23 DIAGNOSIS — M6281 Muscle weakness (generalized): Secondary | ICD-10-CM | POA: Diagnosis not present

## 2019-01-23 DIAGNOSIS — R278 Other lack of coordination: Secondary | ICD-10-CM | POA: Diagnosis not present

## 2019-01-25 DIAGNOSIS — M25512 Pain in left shoulder: Secondary | ICD-10-CM | POA: Diagnosis not present

## 2019-01-25 DIAGNOSIS — M6281 Muscle weakness (generalized): Secondary | ICD-10-CM | POA: Diagnosis not present

## 2019-01-25 DIAGNOSIS — R278 Other lack of coordination: Secondary | ICD-10-CM | POA: Diagnosis not present

## 2019-01-27 DIAGNOSIS — M6281 Muscle weakness (generalized): Secondary | ICD-10-CM | POA: Diagnosis not present

## 2019-01-27 DIAGNOSIS — R278 Other lack of coordination: Secondary | ICD-10-CM | POA: Diagnosis not present

## 2019-01-27 DIAGNOSIS — M25512 Pain in left shoulder: Secondary | ICD-10-CM | POA: Diagnosis not present

## 2019-01-30 DIAGNOSIS — M25512 Pain in left shoulder: Secondary | ICD-10-CM | POA: Diagnosis not present

## 2019-01-30 DIAGNOSIS — R278 Other lack of coordination: Secondary | ICD-10-CM | POA: Diagnosis not present

## 2019-01-30 DIAGNOSIS — M6281 Muscle weakness (generalized): Secondary | ICD-10-CM | POA: Diagnosis not present

## 2019-02-01 DIAGNOSIS — M25512 Pain in left shoulder: Secondary | ICD-10-CM | POA: Diagnosis not present

## 2019-02-01 DIAGNOSIS — M6281 Muscle weakness (generalized): Secondary | ICD-10-CM | POA: Diagnosis not present

## 2019-02-01 DIAGNOSIS — R278 Other lack of coordination: Secondary | ICD-10-CM | POA: Diagnosis not present

## 2019-02-03 DIAGNOSIS — M25512 Pain in left shoulder: Secondary | ICD-10-CM | POA: Diagnosis not present

## 2019-02-03 DIAGNOSIS — M6281 Muscle weakness (generalized): Secondary | ICD-10-CM | POA: Diagnosis not present

## 2019-02-03 DIAGNOSIS — R278 Other lack of coordination: Secondary | ICD-10-CM | POA: Diagnosis not present

## 2019-02-06 DIAGNOSIS — R278 Other lack of coordination: Secondary | ICD-10-CM | POA: Diagnosis not present

## 2019-02-06 DIAGNOSIS — M6281 Muscle weakness (generalized): Secondary | ICD-10-CM | POA: Diagnosis not present

## 2019-02-06 DIAGNOSIS — M25512 Pain in left shoulder: Secondary | ICD-10-CM | POA: Diagnosis not present

## 2019-02-08 DIAGNOSIS — M6281 Muscle weakness (generalized): Secondary | ICD-10-CM | POA: Diagnosis not present

## 2019-02-08 DIAGNOSIS — M25512 Pain in left shoulder: Secondary | ICD-10-CM | POA: Diagnosis not present

## 2019-02-08 DIAGNOSIS — R278 Other lack of coordination: Secondary | ICD-10-CM | POA: Diagnosis not present

## 2019-02-10 DIAGNOSIS — M6281 Muscle weakness (generalized): Secondary | ICD-10-CM | POA: Diagnosis not present

## 2019-02-10 DIAGNOSIS — M25512 Pain in left shoulder: Secondary | ICD-10-CM | POA: Diagnosis not present

## 2019-02-10 DIAGNOSIS — R278 Other lack of coordination: Secondary | ICD-10-CM | POA: Diagnosis not present

## 2019-02-13 DIAGNOSIS — R278 Other lack of coordination: Secondary | ICD-10-CM | POA: Diagnosis not present

## 2019-02-13 DIAGNOSIS — M6281 Muscle weakness (generalized): Secondary | ICD-10-CM | POA: Diagnosis not present

## 2019-02-13 DIAGNOSIS — M25512 Pain in left shoulder: Secondary | ICD-10-CM | POA: Diagnosis not present

## 2019-02-15 DIAGNOSIS — M25512 Pain in left shoulder: Secondary | ICD-10-CM | POA: Diagnosis not present

## 2019-02-15 DIAGNOSIS — R278 Other lack of coordination: Secondary | ICD-10-CM | POA: Diagnosis not present

## 2019-02-15 DIAGNOSIS — M6281 Muscle weakness (generalized): Secondary | ICD-10-CM | POA: Diagnosis not present

## 2019-02-17 DIAGNOSIS — M6281 Muscle weakness (generalized): Secondary | ICD-10-CM | POA: Diagnosis not present

## 2019-02-17 DIAGNOSIS — M25512 Pain in left shoulder: Secondary | ICD-10-CM | POA: Diagnosis not present

## 2019-02-17 DIAGNOSIS — R278 Other lack of coordination: Secondary | ICD-10-CM | POA: Diagnosis not present

## 2019-02-20 DIAGNOSIS — R278 Other lack of coordination: Secondary | ICD-10-CM | POA: Diagnosis not present

## 2019-02-20 DIAGNOSIS — M6281 Muscle weakness (generalized): Secondary | ICD-10-CM | POA: Diagnosis not present

## 2019-02-20 DIAGNOSIS — M25512 Pain in left shoulder: Secondary | ICD-10-CM | POA: Diagnosis not present

## 2019-02-22 DIAGNOSIS — M25512 Pain in left shoulder: Secondary | ICD-10-CM | POA: Diagnosis not present

## 2019-02-22 DIAGNOSIS — M6281 Muscle weakness (generalized): Secondary | ICD-10-CM | POA: Diagnosis not present

## 2019-02-22 DIAGNOSIS — R278 Other lack of coordination: Secondary | ICD-10-CM | POA: Diagnosis not present

## 2019-02-24 DIAGNOSIS — M25512 Pain in left shoulder: Secondary | ICD-10-CM | POA: Diagnosis not present

## 2019-02-24 DIAGNOSIS — R278 Other lack of coordination: Secondary | ICD-10-CM | POA: Diagnosis not present

## 2019-02-24 DIAGNOSIS — M6281 Muscle weakness (generalized): Secondary | ICD-10-CM | POA: Diagnosis not present

## 2019-02-27 DIAGNOSIS — R278 Other lack of coordination: Secondary | ICD-10-CM | POA: Diagnosis not present

## 2019-02-27 DIAGNOSIS — M6281 Muscle weakness (generalized): Secondary | ICD-10-CM | POA: Diagnosis not present

## 2019-02-27 DIAGNOSIS — M25512 Pain in left shoulder: Secondary | ICD-10-CM | POA: Diagnosis not present

## 2019-03-01 DIAGNOSIS — M25512 Pain in left shoulder: Secondary | ICD-10-CM | POA: Diagnosis not present

## 2019-03-01 DIAGNOSIS — R278 Other lack of coordination: Secondary | ICD-10-CM | POA: Diagnosis not present

## 2019-03-01 DIAGNOSIS — M6281 Muscle weakness (generalized): Secondary | ICD-10-CM | POA: Diagnosis not present

## 2019-03-03 DIAGNOSIS — R278 Other lack of coordination: Secondary | ICD-10-CM | POA: Diagnosis not present

## 2019-03-03 DIAGNOSIS — M6281 Muscle weakness (generalized): Secondary | ICD-10-CM | POA: Diagnosis not present

## 2019-03-03 DIAGNOSIS — M25512 Pain in left shoulder: Secondary | ICD-10-CM | POA: Diagnosis not present

## 2019-03-06 DIAGNOSIS — M25512 Pain in left shoulder: Secondary | ICD-10-CM | POA: Diagnosis not present

## 2019-03-06 DIAGNOSIS — R278 Other lack of coordination: Secondary | ICD-10-CM | POA: Diagnosis not present

## 2019-03-06 DIAGNOSIS — M6281 Muscle weakness (generalized): Secondary | ICD-10-CM | POA: Diagnosis not present

## 2019-03-08 DIAGNOSIS — D485 Neoplasm of uncertain behavior of skin: Secondary | ICD-10-CM | POA: Diagnosis not present

## 2019-03-09 DIAGNOSIS — L738 Other specified follicular disorders: Secondary | ICD-10-CM | POA: Diagnosis not present

## 2019-03-10 DIAGNOSIS — M25512 Pain in left shoulder: Secondary | ICD-10-CM | POA: Diagnosis not present

## 2019-03-10 DIAGNOSIS — M6281 Muscle weakness (generalized): Secondary | ICD-10-CM | POA: Diagnosis not present

## 2019-03-10 DIAGNOSIS — R278 Other lack of coordination: Secondary | ICD-10-CM | POA: Diagnosis not present

## 2019-03-13 DIAGNOSIS — R278 Other lack of coordination: Secondary | ICD-10-CM | POA: Diagnosis not present

## 2019-03-13 DIAGNOSIS — M25512 Pain in left shoulder: Secondary | ICD-10-CM | POA: Diagnosis not present

## 2019-03-13 DIAGNOSIS — M6281 Muscle weakness (generalized): Secondary | ICD-10-CM | POA: Diagnosis not present

## 2019-03-16 DIAGNOSIS — R278 Other lack of coordination: Secondary | ICD-10-CM | POA: Diagnosis not present

## 2019-03-16 DIAGNOSIS — M25512 Pain in left shoulder: Secondary | ICD-10-CM | POA: Diagnosis not present

## 2019-03-16 DIAGNOSIS — M6281 Muscle weakness (generalized): Secondary | ICD-10-CM | POA: Diagnosis not present

## 2019-03-20 DIAGNOSIS — Z96619 Presence of unspecified artificial shoulder joint: Secondary | ICD-10-CM | POA: Diagnosis not present

## 2019-03-20 DIAGNOSIS — I10 Essential (primary) hypertension: Secondary | ICD-10-CM | POA: Diagnosis not present

## 2019-03-20 DIAGNOSIS — E039 Hypothyroidism, unspecified: Secondary | ICD-10-CM | POA: Diagnosis not present

## 2019-03-20 DIAGNOSIS — N183 Chronic kidney disease, stage 3 (moderate): Secondary | ICD-10-CM | POA: Diagnosis not present

## 2019-03-20 DIAGNOSIS — M8580 Other specified disorders of bone density and structure, unspecified site: Secondary | ICD-10-CM | POA: Diagnosis not present

## 2019-03-20 DIAGNOSIS — K5900 Constipation, unspecified: Secondary | ICD-10-CM | POA: Diagnosis not present

## 2019-03-20 DIAGNOSIS — R278 Other lack of coordination: Secondary | ICD-10-CM | POA: Diagnosis not present

## 2019-03-20 DIAGNOSIS — H409 Unspecified glaucoma: Secondary | ICD-10-CM | POA: Diagnosis not present

## 2019-03-20 DIAGNOSIS — R600 Localized edema: Secondary | ICD-10-CM | POA: Diagnosis not present

## 2019-03-20 DIAGNOSIS — M6281 Muscle weakness (generalized): Secondary | ICD-10-CM | POA: Diagnosis not present

## 2019-03-20 DIAGNOSIS — M25512 Pain in left shoulder: Secondary | ICD-10-CM | POA: Diagnosis not present

## 2019-03-20 DIAGNOSIS — Z1331 Encounter for screening for depression: Secondary | ICD-10-CM | POA: Diagnosis not present

## 2019-03-20 DIAGNOSIS — R809 Proteinuria, unspecified: Secondary | ICD-10-CM | POA: Diagnosis not present

## 2019-03-23 DIAGNOSIS — R278 Other lack of coordination: Secondary | ICD-10-CM | POA: Diagnosis not present

## 2019-03-23 DIAGNOSIS — M25512 Pain in left shoulder: Secondary | ICD-10-CM | POA: Diagnosis not present

## 2019-03-23 DIAGNOSIS — M6281 Muscle weakness (generalized): Secondary | ICD-10-CM | POA: Diagnosis not present

## 2019-03-27 DIAGNOSIS — Z961 Presence of intraocular lens: Secondary | ICD-10-CM | POA: Diagnosis not present

## 2019-03-27 DIAGNOSIS — R278 Other lack of coordination: Secondary | ICD-10-CM | POA: Diagnosis not present

## 2019-03-27 DIAGNOSIS — H401131 Primary open-angle glaucoma, bilateral, mild stage: Secondary | ICD-10-CM | POA: Diagnosis not present

## 2019-03-27 DIAGNOSIS — H43812 Vitreous degeneration, left eye: Secondary | ICD-10-CM | POA: Diagnosis not present

## 2019-03-27 DIAGNOSIS — M6281 Muscle weakness (generalized): Secondary | ICD-10-CM | POA: Diagnosis not present

## 2019-03-27 DIAGNOSIS — M25512 Pain in left shoulder: Secondary | ICD-10-CM | POA: Diagnosis not present

## 2019-03-30 DIAGNOSIS — M25512 Pain in left shoulder: Secondary | ICD-10-CM | POA: Diagnosis not present

## 2019-03-30 DIAGNOSIS — R278 Other lack of coordination: Secondary | ICD-10-CM | POA: Diagnosis not present

## 2019-03-30 DIAGNOSIS — M6281 Muscle weakness (generalized): Secondary | ICD-10-CM | POA: Diagnosis not present

## 2019-04-03 DIAGNOSIS — R278 Other lack of coordination: Secondary | ICD-10-CM | POA: Diagnosis not present

## 2019-04-03 DIAGNOSIS — M25512 Pain in left shoulder: Secondary | ICD-10-CM | POA: Diagnosis not present

## 2019-04-03 DIAGNOSIS — M6281 Muscle weakness (generalized): Secondary | ICD-10-CM | POA: Diagnosis not present

## 2019-04-06 DIAGNOSIS — R278 Other lack of coordination: Secondary | ICD-10-CM | POA: Diagnosis not present

## 2019-04-06 DIAGNOSIS — M6281 Muscle weakness (generalized): Secondary | ICD-10-CM | POA: Diagnosis not present

## 2019-04-06 DIAGNOSIS — M25512 Pain in left shoulder: Secondary | ICD-10-CM | POA: Diagnosis not present

## 2019-04-10 DIAGNOSIS — Z961 Presence of intraocular lens: Secondary | ICD-10-CM | POA: Diagnosis not present

## 2019-04-10 DIAGNOSIS — M6281 Muscle weakness (generalized): Secondary | ICD-10-CM | POA: Diagnosis not present

## 2019-04-10 DIAGNOSIS — M25512 Pain in left shoulder: Secondary | ICD-10-CM | POA: Diagnosis not present

## 2019-04-10 DIAGNOSIS — H401131 Primary open-angle glaucoma, bilateral, mild stage: Secondary | ICD-10-CM | POA: Diagnosis not present

## 2019-04-10 DIAGNOSIS — R278 Other lack of coordination: Secondary | ICD-10-CM | POA: Diagnosis not present

## 2019-04-13 DIAGNOSIS — R278 Other lack of coordination: Secondary | ICD-10-CM | POA: Diagnosis not present

## 2019-04-13 DIAGNOSIS — M6281 Muscle weakness (generalized): Secondary | ICD-10-CM | POA: Diagnosis not present

## 2019-04-13 DIAGNOSIS — M25512 Pain in left shoulder: Secondary | ICD-10-CM | POA: Diagnosis not present

## 2019-04-17 DIAGNOSIS — M25512 Pain in left shoulder: Secondary | ICD-10-CM | POA: Diagnosis not present

## 2019-04-17 DIAGNOSIS — R278 Other lack of coordination: Secondary | ICD-10-CM | POA: Diagnosis not present

## 2019-04-17 DIAGNOSIS — M6281 Muscle weakness (generalized): Secondary | ICD-10-CM | POA: Diagnosis not present

## 2019-04-21 DIAGNOSIS — M6281 Muscle weakness (generalized): Secondary | ICD-10-CM | POA: Diagnosis not present

## 2019-04-21 DIAGNOSIS — M25512 Pain in left shoulder: Secondary | ICD-10-CM | POA: Diagnosis not present

## 2019-04-21 DIAGNOSIS — R278 Other lack of coordination: Secondary | ICD-10-CM | POA: Diagnosis not present

## 2019-04-24 DIAGNOSIS — M25512 Pain in left shoulder: Secondary | ICD-10-CM | POA: Diagnosis not present

## 2019-04-24 DIAGNOSIS — M6281 Muscle weakness (generalized): Secondary | ICD-10-CM | POA: Diagnosis not present

## 2019-04-24 DIAGNOSIS — R278 Other lack of coordination: Secondary | ICD-10-CM | POA: Diagnosis not present

## 2019-04-27 DIAGNOSIS — M25512 Pain in left shoulder: Secondary | ICD-10-CM | POA: Diagnosis not present

## 2019-04-27 DIAGNOSIS — R278 Other lack of coordination: Secondary | ICD-10-CM | POA: Diagnosis not present

## 2019-04-27 DIAGNOSIS — M6281 Muscle weakness (generalized): Secondary | ICD-10-CM | POA: Diagnosis not present

## 2019-05-02 DIAGNOSIS — M25512 Pain in left shoulder: Secondary | ICD-10-CM | POA: Diagnosis not present

## 2019-05-02 DIAGNOSIS — M6281 Muscle weakness (generalized): Secondary | ICD-10-CM | POA: Diagnosis not present

## 2019-05-02 DIAGNOSIS — R278 Other lack of coordination: Secondary | ICD-10-CM | POA: Diagnosis not present

## 2019-05-04 DIAGNOSIS — M6281 Muscle weakness (generalized): Secondary | ICD-10-CM | POA: Diagnosis not present

## 2019-05-04 DIAGNOSIS — M25512 Pain in left shoulder: Secondary | ICD-10-CM | POA: Diagnosis not present

## 2019-05-04 DIAGNOSIS — R278 Other lack of coordination: Secondary | ICD-10-CM | POA: Diagnosis not present

## 2019-05-22 DIAGNOSIS — Z961 Presence of intraocular lens: Secondary | ICD-10-CM | POA: Diagnosis not present

## 2019-05-22 DIAGNOSIS — H401131 Primary open-angle glaucoma, bilateral, mild stage: Secondary | ICD-10-CM | POA: Diagnosis not present

## 2019-05-22 DIAGNOSIS — H43812 Vitreous degeneration, left eye: Secondary | ICD-10-CM | POA: Diagnosis not present

## 2019-06-21 DIAGNOSIS — Z96612 Presence of left artificial shoulder joint: Secondary | ICD-10-CM | POA: Diagnosis not present

## 2019-06-21 DIAGNOSIS — Z471 Aftercare following joint replacement surgery: Secondary | ICD-10-CM | POA: Diagnosis not present

## 2019-08-29 DIAGNOSIS — E038 Other specified hypothyroidism: Secondary | ICD-10-CM | POA: Diagnosis not present

## 2019-08-29 DIAGNOSIS — I10 Essential (primary) hypertension: Secondary | ICD-10-CM | POA: Diagnosis not present

## 2019-08-29 DIAGNOSIS — R82998 Other abnormal findings in urine: Secondary | ICD-10-CM | POA: Diagnosis not present

## 2019-08-29 DIAGNOSIS — M8580 Other specified disorders of bone density and structure, unspecified site: Secondary | ICD-10-CM | POA: Diagnosis not present

## 2019-08-29 DIAGNOSIS — M859 Disorder of bone density and structure, unspecified: Secondary | ICD-10-CM | POA: Diagnosis not present

## 2019-08-30 DIAGNOSIS — Z119 Encounter for screening for infectious and parasitic diseases, unspecified: Secondary | ICD-10-CM | POA: Diagnosis not present

## 2019-09-06 DIAGNOSIS — Z1339 Encounter for screening examination for other mental health and behavioral disorders: Secondary | ICD-10-CM | POA: Diagnosis not present

## 2019-09-06 DIAGNOSIS — H259 Unspecified age-related cataract: Secondary | ICD-10-CM | POA: Diagnosis not present

## 2019-09-06 DIAGNOSIS — K5900 Constipation, unspecified: Secondary | ICD-10-CM | POA: Diagnosis not present

## 2019-09-06 DIAGNOSIS — R809 Proteinuria, unspecified: Secondary | ICD-10-CM | POA: Diagnosis not present

## 2019-09-06 DIAGNOSIS — N183 Chronic kidney disease, stage 3 (moderate): Secondary | ICD-10-CM | POA: Diagnosis not present

## 2019-09-06 DIAGNOSIS — I8390 Asymptomatic varicose veins of unspecified lower extremity: Secondary | ICD-10-CM | POA: Diagnosis not present

## 2019-09-06 DIAGNOSIS — E039 Hypothyroidism, unspecified: Secondary | ICD-10-CM | POA: Diagnosis not present

## 2019-09-06 DIAGNOSIS — I129 Hypertensive chronic kidney disease with stage 1 through stage 4 chronic kidney disease, or unspecified chronic kidney disease: Secondary | ICD-10-CM | POA: Diagnosis not present

## 2019-09-06 DIAGNOSIS — E7800 Pure hypercholesterolemia, unspecified: Secondary | ICD-10-CM | POA: Diagnosis not present

## 2019-09-06 DIAGNOSIS — Z0000 Encounter for general adult medical examination without abnormal findings: Secondary | ICD-10-CM | POA: Diagnosis not present

## 2019-09-06 DIAGNOSIS — R600 Localized edema: Secondary | ICD-10-CM | POA: Diagnosis not present

## 2019-09-06 DIAGNOSIS — M8580 Other specified disorders of bone density and structure, unspecified site: Secondary | ICD-10-CM | POA: Diagnosis not present

## 2019-09-20 DIAGNOSIS — H43812 Vitreous degeneration, left eye: Secondary | ICD-10-CM | POA: Diagnosis not present

## 2019-09-20 DIAGNOSIS — H401131 Primary open-angle glaucoma, bilateral, mild stage: Secondary | ICD-10-CM | POA: Diagnosis not present

## 2019-09-20 DIAGNOSIS — Z961 Presence of intraocular lens: Secondary | ICD-10-CM | POA: Diagnosis not present

## 2019-11-27 DIAGNOSIS — Z1159 Encounter for screening for other viral diseases: Secondary | ICD-10-CM | POA: Diagnosis not present

## 2019-11-27 DIAGNOSIS — Z20828 Contact with and (suspected) exposure to other viral communicable diseases: Secondary | ICD-10-CM | POA: Diagnosis not present

## 2019-12-04 DIAGNOSIS — Z1159 Encounter for screening for other viral diseases: Secondary | ICD-10-CM | POA: Diagnosis not present

## 2019-12-04 DIAGNOSIS — Z20828 Contact with and (suspected) exposure to other viral communicable diseases: Secondary | ICD-10-CM | POA: Diagnosis not present

## 2019-12-11 DIAGNOSIS — Z20828 Contact with and (suspected) exposure to other viral communicable diseases: Secondary | ICD-10-CM | POA: Diagnosis not present

## 2019-12-11 DIAGNOSIS — Z1159 Encounter for screening for other viral diseases: Secondary | ICD-10-CM | POA: Diagnosis not present

## 2019-12-14 DIAGNOSIS — Z1159 Encounter for screening for other viral diseases: Secondary | ICD-10-CM | POA: Diagnosis not present

## 2019-12-14 DIAGNOSIS — Z20828 Contact with and (suspected) exposure to other viral communicable diseases: Secondary | ICD-10-CM | POA: Diagnosis not present

## 2019-12-18 DIAGNOSIS — Z1159 Encounter for screening for other viral diseases: Secondary | ICD-10-CM | POA: Diagnosis not present

## 2019-12-18 DIAGNOSIS — Z20828 Contact with and (suspected) exposure to other viral communicable diseases: Secondary | ICD-10-CM | POA: Diagnosis not present

## 2019-12-21 DIAGNOSIS — Z20828 Contact with and (suspected) exposure to other viral communicable diseases: Secondary | ICD-10-CM | POA: Diagnosis not present

## 2019-12-21 DIAGNOSIS — Z1159 Encounter for screening for other viral diseases: Secondary | ICD-10-CM | POA: Diagnosis not present

## 2019-12-25 DIAGNOSIS — Z20828 Contact with and (suspected) exposure to other viral communicable diseases: Secondary | ICD-10-CM | POA: Diagnosis not present

## 2019-12-25 DIAGNOSIS — Z1159 Encounter for screening for other viral diseases: Secondary | ICD-10-CM | POA: Diagnosis not present

## 2019-12-28 DIAGNOSIS — Z23 Encounter for immunization: Secondary | ICD-10-CM | POA: Diagnosis not present

## 2020-01-01 DIAGNOSIS — Z1159 Encounter for screening for other viral diseases: Secondary | ICD-10-CM | POA: Diagnosis not present

## 2020-01-01 DIAGNOSIS — Z20828 Contact with and (suspected) exposure to other viral communicable diseases: Secondary | ICD-10-CM | POA: Diagnosis not present

## 2020-01-08 DIAGNOSIS — Z20828 Contact with and (suspected) exposure to other viral communicable diseases: Secondary | ICD-10-CM | POA: Diagnosis not present

## 2020-01-08 DIAGNOSIS — Z1159 Encounter for screening for other viral diseases: Secondary | ICD-10-CM | POA: Diagnosis not present

## 2020-01-15 DIAGNOSIS — Z1159 Encounter for screening for other viral diseases: Secondary | ICD-10-CM | POA: Diagnosis not present

## 2020-01-15 DIAGNOSIS — Z20828 Contact with and (suspected) exposure to other viral communicable diseases: Secondary | ICD-10-CM | POA: Diagnosis not present

## 2020-01-17 DIAGNOSIS — H43812 Vitreous degeneration, left eye: Secondary | ICD-10-CM | POA: Diagnosis not present

## 2020-01-17 DIAGNOSIS — H401131 Primary open-angle glaucoma, bilateral, mild stage: Secondary | ICD-10-CM | POA: Diagnosis not present

## 2020-01-17 DIAGNOSIS — Z961 Presence of intraocular lens: Secondary | ICD-10-CM | POA: Diagnosis not present

## 2020-01-22 DIAGNOSIS — Z20828 Contact with and (suspected) exposure to other viral communicable diseases: Secondary | ICD-10-CM | POA: Diagnosis not present

## 2020-01-22 DIAGNOSIS — Z1159 Encounter for screening for other viral diseases: Secondary | ICD-10-CM | POA: Diagnosis not present

## 2020-01-25 DIAGNOSIS — Z23 Encounter for immunization: Secondary | ICD-10-CM | POA: Diagnosis not present

## 2020-01-29 DIAGNOSIS — Z1159 Encounter for screening for other viral diseases: Secondary | ICD-10-CM | POA: Diagnosis not present

## 2020-01-29 DIAGNOSIS — Z20828 Contact with and (suspected) exposure to other viral communicable diseases: Secondary | ICD-10-CM | POA: Diagnosis not present

## 2020-02-05 DIAGNOSIS — Z1159 Encounter for screening for other viral diseases: Secondary | ICD-10-CM | POA: Diagnosis not present

## 2020-02-05 DIAGNOSIS — Z20828 Contact with and (suspected) exposure to other viral communicable diseases: Secondary | ICD-10-CM | POA: Diagnosis not present

## 2020-02-12 DIAGNOSIS — Z20828 Contact with and (suspected) exposure to other viral communicable diseases: Secondary | ICD-10-CM | POA: Diagnosis not present

## 2020-02-12 DIAGNOSIS — Z1159 Encounter for screening for other viral diseases: Secondary | ICD-10-CM | POA: Diagnosis not present

## 2020-02-19 DIAGNOSIS — Z1159 Encounter for screening for other viral diseases: Secondary | ICD-10-CM | POA: Diagnosis not present

## 2020-02-19 DIAGNOSIS — Z20828 Contact with and (suspected) exposure to other viral communicable diseases: Secondary | ICD-10-CM | POA: Diagnosis not present

## 2020-03-04 DIAGNOSIS — Z20828 Contact with and (suspected) exposure to other viral communicable diseases: Secondary | ICD-10-CM | POA: Diagnosis not present

## 2020-03-04 DIAGNOSIS — Z1159 Encounter for screening for other viral diseases: Secondary | ICD-10-CM | POA: Diagnosis not present

## 2020-03-06 DIAGNOSIS — H259 Unspecified age-related cataract: Secondary | ICD-10-CM | POA: Diagnosis not present

## 2020-03-06 DIAGNOSIS — I8390 Asymptomatic varicose veins of unspecified lower extremity: Secondary | ICD-10-CM | POA: Diagnosis not present

## 2020-03-06 DIAGNOSIS — I129 Hypertensive chronic kidney disease with stage 1 through stage 4 chronic kidney disease, or unspecified chronic kidney disease: Secondary | ICD-10-CM | POA: Diagnosis not present

## 2020-03-06 DIAGNOSIS — E7800 Pure hypercholesterolemia, unspecified: Secondary | ICD-10-CM | POA: Diagnosis not present

## 2020-03-06 DIAGNOSIS — E039 Hypothyroidism, unspecified: Secondary | ICD-10-CM | POA: Diagnosis not present

## 2020-03-06 DIAGNOSIS — N1832 Chronic kidney disease, stage 3b: Secondary | ICD-10-CM | POA: Diagnosis not present

## 2020-03-06 DIAGNOSIS — H409 Unspecified glaucoma: Secondary | ICD-10-CM | POA: Diagnosis not present

## 2020-03-06 DIAGNOSIS — M8580 Other specified disorders of bone density and structure, unspecified site: Secondary | ICD-10-CM | POA: Diagnosis not present

## 2020-03-06 DIAGNOSIS — R809 Proteinuria, unspecified: Secondary | ICD-10-CM | POA: Diagnosis not present

## 2020-03-06 DIAGNOSIS — K5900 Constipation, unspecified: Secondary | ICD-10-CM | POA: Diagnosis not present

## 2020-03-11 DIAGNOSIS — Z20828 Contact with and (suspected) exposure to other viral communicable diseases: Secondary | ICD-10-CM | POA: Diagnosis not present

## 2020-03-11 DIAGNOSIS — Z1159 Encounter for screening for other viral diseases: Secondary | ICD-10-CM | POA: Diagnosis not present

## 2020-03-18 DIAGNOSIS — Z20828 Contact with and (suspected) exposure to other viral communicable diseases: Secondary | ICD-10-CM | POA: Diagnosis not present

## 2020-03-18 DIAGNOSIS — Z1159 Encounter for screening for other viral diseases: Secondary | ICD-10-CM | POA: Diagnosis not present

## 2020-03-25 DIAGNOSIS — Z1159 Encounter for screening for other viral diseases: Secondary | ICD-10-CM | POA: Diagnosis not present

## 2020-03-25 DIAGNOSIS — Z20828 Contact with and (suspected) exposure to other viral communicable diseases: Secondary | ICD-10-CM | POA: Diagnosis not present

## 2020-04-01 DIAGNOSIS — Z20828 Contact with and (suspected) exposure to other viral communicable diseases: Secondary | ICD-10-CM | POA: Diagnosis not present

## 2020-04-01 DIAGNOSIS — Z1159 Encounter for screening for other viral diseases: Secondary | ICD-10-CM | POA: Diagnosis not present

## 2020-04-08 DIAGNOSIS — Z20828 Contact with and (suspected) exposure to other viral communicable diseases: Secondary | ICD-10-CM | POA: Diagnosis not present

## 2020-04-08 DIAGNOSIS — Z1159 Encounter for screening for other viral diseases: Secondary | ICD-10-CM | POA: Diagnosis not present

## 2020-04-15 DIAGNOSIS — Z20828 Contact with and (suspected) exposure to other viral communicable diseases: Secondary | ICD-10-CM | POA: Diagnosis not present

## 2020-04-15 DIAGNOSIS — Z1159 Encounter for screening for other viral diseases: Secondary | ICD-10-CM | POA: Diagnosis not present

## 2020-04-22 DIAGNOSIS — Z20828 Contact with and (suspected) exposure to other viral communicable diseases: Secondary | ICD-10-CM | POA: Diagnosis not present

## 2020-04-22 DIAGNOSIS — Z1159 Encounter for screening for other viral diseases: Secondary | ICD-10-CM | POA: Diagnosis not present

## 2020-04-29 DIAGNOSIS — Z20828 Contact with and (suspected) exposure to other viral communicable diseases: Secondary | ICD-10-CM | POA: Diagnosis not present

## 2020-04-29 DIAGNOSIS — Z1159 Encounter for screening for other viral diseases: Secondary | ICD-10-CM | POA: Diagnosis not present

## 2020-05-06 DIAGNOSIS — Z1159 Encounter for screening for other viral diseases: Secondary | ICD-10-CM | POA: Diagnosis not present

## 2020-05-06 DIAGNOSIS — Z20828 Contact with and (suspected) exposure to other viral communicable diseases: Secondary | ICD-10-CM | POA: Diagnosis not present

## 2020-05-13 DIAGNOSIS — Z1159 Encounter for screening for other viral diseases: Secondary | ICD-10-CM | POA: Diagnosis not present

## 2020-05-13 DIAGNOSIS — Z20828 Contact with and (suspected) exposure to other viral communicable diseases: Secondary | ICD-10-CM | POA: Diagnosis not present

## 2020-06-17 DIAGNOSIS — Z1159 Encounter for screening for other viral diseases: Secondary | ICD-10-CM | POA: Diagnosis not present

## 2020-06-17 DIAGNOSIS — Z20828 Contact with and (suspected) exposure to other viral communicable diseases: Secondary | ICD-10-CM | POA: Diagnosis not present

## 2020-06-24 DIAGNOSIS — Z20828 Contact with and (suspected) exposure to other viral communicable diseases: Secondary | ICD-10-CM | POA: Diagnosis not present

## 2020-06-24 DIAGNOSIS — Z1159 Encounter for screening for other viral diseases: Secondary | ICD-10-CM | POA: Diagnosis not present

## 2020-06-25 DIAGNOSIS — D229 Melanocytic nevi, unspecified: Secondary | ICD-10-CM | POA: Diagnosis not present

## 2020-06-25 DIAGNOSIS — D1801 Hemangioma of skin and subcutaneous tissue: Secondary | ICD-10-CM | POA: Diagnosis not present

## 2020-06-25 DIAGNOSIS — L570 Actinic keratosis: Secondary | ICD-10-CM | POA: Diagnosis not present

## 2020-06-25 DIAGNOSIS — L814 Other melanin hyperpigmentation: Secondary | ICD-10-CM | POA: Diagnosis not present

## 2020-06-25 DIAGNOSIS — L821 Other seborrheic keratosis: Secondary | ICD-10-CM | POA: Diagnosis not present

## 2020-07-01 DIAGNOSIS — Z20828 Contact with and (suspected) exposure to other viral communicable diseases: Secondary | ICD-10-CM | POA: Diagnosis not present

## 2020-07-01 DIAGNOSIS — Z1159 Encounter for screening for other viral diseases: Secondary | ICD-10-CM | POA: Diagnosis not present

## 2020-07-08 DIAGNOSIS — Z1159 Encounter for screening for other viral diseases: Secondary | ICD-10-CM | POA: Diagnosis not present

## 2020-07-08 DIAGNOSIS — Z20828 Contact with and (suspected) exposure to other viral communicable diseases: Secondary | ICD-10-CM | POA: Diagnosis not present

## 2020-07-15 DIAGNOSIS — Z1159 Encounter for screening for other viral diseases: Secondary | ICD-10-CM | POA: Diagnosis not present

## 2020-07-15 DIAGNOSIS — Z20828 Contact with and (suspected) exposure to other viral communicable diseases: Secondary | ICD-10-CM | POA: Diagnosis not present

## 2020-07-22 DIAGNOSIS — Z1159 Encounter for screening for other viral diseases: Secondary | ICD-10-CM | POA: Diagnosis not present

## 2020-07-22 DIAGNOSIS — Z20828 Contact with and (suspected) exposure to other viral communicable diseases: Secondary | ICD-10-CM | POA: Diagnosis not present

## 2020-07-29 DIAGNOSIS — Z20828 Contact with and (suspected) exposure to other viral communicable diseases: Secondary | ICD-10-CM | POA: Diagnosis not present

## 2020-07-29 DIAGNOSIS — Z1159 Encounter for screening for other viral diseases: Secondary | ICD-10-CM | POA: Diagnosis not present

## 2020-08-01 DIAGNOSIS — Z20828 Contact with and (suspected) exposure to other viral communicable diseases: Secondary | ICD-10-CM | POA: Diagnosis not present

## 2020-08-01 DIAGNOSIS — Z1159 Encounter for screening for other viral diseases: Secondary | ICD-10-CM | POA: Diagnosis not present

## 2020-08-05 DIAGNOSIS — Z20828 Contact with and (suspected) exposure to other viral communicable diseases: Secondary | ICD-10-CM | POA: Diagnosis not present

## 2020-08-05 DIAGNOSIS — Z1159 Encounter for screening for other viral diseases: Secondary | ICD-10-CM | POA: Diagnosis not present

## 2020-08-12 DIAGNOSIS — Z20828 Contact with and (suspected) exposure to other viral communicable diseases: Secondary | ICD-10-CM | POA: Diagnosis not present

## 2020-08-12 DIAGNOSIS — Z1159 Encounter for screening for other viral diseases: Secondary | ICD-10-CM | POA: Diagnosis not present

## 2020-08-19 DIAGNOSIS — Z20828 Contact with and (suspected) exposure to other viral communicable diseases: Secondary | ICD-10-CM | POA: Diagnosis not present

## 2020-08-19 DIAGNOSIS — Z1159 Encounter for screening for other viral diseases: Secondary | ICD-10-CM | POA: Diagnosis not present

## 2020-08-26 DIAGNOSIS — Z20828 Contact with and (suspected) exposure to other viral communicable diseases: Secondary | ICD-10-CM | POA: Diagnosis not present

## 2020-08-26 DIAGNOSIS — Z1159 Encounter for screening for other viral diseases: Secondary | ICD-10-CM | POA: Diagnosis not present

## 2020-08-27 ENCOUNTER — Emergency Department (HOSPITAL_COMMUNITY): Payer: Medicare Other

## 2020-08-27 ENCOUNTER — Other Ambulatory Visit: Payer: Self-pay

## 2020-08-27 ENCOUNTER — Emergency Department (HOSPITAL_COMMUNITY)
Admission: EM | Admit: 2020-08-27 | Discharge: 2020-08-27 | Disposition: A | Discharge status: Home / Self Care | Payer: Medicare Other | Attending: Emergency Medicine | Admitting: Emergency Medicine

## 2020-08-27 DIAGNOSIS — Y9289 Other specified places as the place of occurrence of the external cause: Secondary | ICD-10-CM | POA: Diagnosis not present

## 2020-08-27 DIAGNOSIS — Z79899 Other long term (current) drug therapy: Secondary | ICD-10-CM | POA: Insufficient documentation

## 2020-08-27 DIAGNOSIS — I10 Essential (primary) hypertension: Secondary | ICD-10-CM | POA: Diagnosis not present

## 2020-08-27 DIAGNOSIS — S0121XA Laceration without foreign body of nose, initial encounter: Secondary | ICD-10-CM | POA: Diagnosis not present

## 2020-08-27 DIAGNOSIS — R52 Pain, unspecified: Secondary | ICD-10-CM | POA: Diagnosis not present

## 2020-08-27 DIAGNOSIS — S199XXA Unspecified injury of neck, initial encounter: Secondary | ICD-10-CM | POA: Diagnosis not present

## 2020-08-27 DIAGNOSIS — S01511A Laceration without foreign body of lip, initial encounter: Secondary | ICD-10-CM

## 2020-08-27 DIAGNOSIS — S0242XA Fracture of alveolus of maxilla, initial encounter for closed fracture: Secondary | ICD-10-CM | POA: Insufficient documentation

## 2020-08-27 DIAGNOSIS — Z96612 Presence of left artificial shoulder joint: Secondary | ICD-10-CM | POA: Diagnosis not present

## 2020-08-27 DIAGNOSIS — R220 Localized swelling, mass and lump, head: Secondary | ICD-10-CM | POA: Diagnosis not present

## 2020-08-27 DIAGNOSIS — S0181XA Laceration without foreign body of other part of head, initial encounter: Secondary | ICD-10-CM | POA: Insufficient documentation

## 2020-08-27 DIAGNOSIS — R040 Epistaxis: Secondary | ICD-10-CM | POA: Diagnosis not present

## 2020-08-27 DIAGNOSIS — S0242XB Fracture of alveolus of maxilla, initial encounter for open fracture: Secondary | ICD-10-CM | POA: Diagnosis not present

## 2020-08-27 DIAGNOSIS — Y9301 Activity, walking, marching and hiking: Secondary | ICD-10-CM | POA: Insufficient documentation

## 2020-08-27 DIAGNOSIS — W19XXXA Unspecified fall, initial encounter: Secondary | ICD-10-CM | POA: Diagnosis not present

## 2020-08-27 DIAGNOSIS — W01198A Fall on same level from slipping, tripping and stumbling with subsequent striking against other object, initial encounter: Secondary | ICD-10-CM | POA: Diagnosis not present

## 2020-08-27 DIAGNOSIS — S51011A Laceration without foreign body of right elbow, initial encounter: Secondary | ICD-10-CM | POA: Diagnosis not present

## 2020-08-27 DIAGNOSIS — Z043 Encounter for examination and observation following other accident: Secondary | ICD-10-CM | POA: Diagnosis not present

## 2020-08-27 DIAGNOSIS — S0990XA Unspecified injury of head, initial encounter: Secondary | ICD-10-CM | POA: Diagnosis not present

## 2020-08-27 DIAGNOSIS — Z23 Encounter for immunization: Secondary | ICD-10-CM | POA: Diagnosis not present

## 2020-08-27 DIAGNOSIS — S300XXA Contusion of lower back and pelvis, initial encounter: Secondary | ICD-10-CM | POA: Diagnosis not present

## 2020-08-27 MED ORDER — TETANUS-DIPHTH-ACELL PERTUSSIS 5-2.5-18.5 LF-MCG/0.5 IM SUSP
0.5000 mL | Freq: Once | INTRAMUSCULAR | Status: AC
  Administered 2020-08-27: 0.5 mL via INTRAMUSCULAR
  Filled 2020-08-27: qty 0.5

## 2020-08-27 MED ORDER — OXYMETAZOLINE HCL 0.05 % NA SOLN
1.0000 | Freq: Once | NASAL | Status: AC
  Administered 2020-08-27: 1 via NASAL
  Filled 2020-08-27: qty 30

## 2020-08-27 MED ORDER — HYDROCODONE-ACETAMINOPHEN 5-325 MG PO TABS
1.0000 | ORAL_TABLET | Freq: Once | ORAL | Status: AC
  Administered 2020-08-27: 1 via ORAL
  Filled 2020-08-27: qty 1

## 2020-08-27 MED ORDER — LIDOCAINE-EPINEPHRINE 2 %-1:100000 IJ SOLN
INTRAMUSCULAR | Status: AC
  Filled 2020-08-27: qty 1

## 2020-08-27 MED ORDER — LIDOCAINE-EPINEPHRINE (PF) 2 %-1:200000 IJ SOLN: 10.0000 mL | Freq: Once | INTRAMUSCULAR | Status: DC

## 2020-08-27 MED ORDER — CLINDAMYCIN HCL 300 MG PO CAPS: 300.0000 mg | ORAL_CAPSULE | Freq: Three times a day (TID) | ORAL | 0 refills | Status: AC

## 2020-08-27 MED ORDER — TRANEXAMIC ACID FOR EPISTAXIS
500.0000 mg | Freq: Once | TOPICAL | Status: AC
  Administered 2020-08-27: 500 mg via TOPICAL
  Filled 2020-08-27: qty 10

## 2020-08-27 MED ORDER — LIDOCAINE HCL (PF) 1 % IJ SOLN
30.0000 mL | Freq: Once | INTRAMUSCULAR | Status: AC
  Administered 2020-08-27: 30 mL
  Filled 2020-08-27: qty 30

## 2020-08-27 NOTE — ED Provider Notes (Signed)
[facility] DEPT Provider Note   CSN: [id number] Arrival date & time: 08/27/20  1600    History Chief Complaint  Patient presents with  . Fall  . Facial Laceration    Valerie Molina is a 86 y.o. female with past medical history significant for hypertension who presents for evaluation of fall.  Patient states she was walking when he tripped on her shoelace on her right foot.  She subsequently fell forward onto her chin.  Suffered laceration to her lip and inner mucosa.  She denies any loose dentition.  She denies prior lightheadedness, dizziness, headache, chest pain, shortness of breath or syncope.  She was ambulatory since the incident.  Unknown last tetanus.  Rates her pain a 9/10.  Not take anything for her pain.  Denies blurry vision, facial droop, chest pain, shortness of breath, abdominal pain, hip pain, extremity pain, rashes.  Denies additional aggravating or alleviating factors.  History obtained from patient and past medical records.  No interpreter is used.   HPI     Past Medical History:  Diagnosis Date  . Dysrhythmia   . Hypertension   . PONV (postoperative nausea and vomiting)     Patient Active Problem List   Diagnosis Date Noted  . S/p reverse total shoulder arthroplasty 09/29/2018    Past Surgical History:  Procedure Laterality Date  . ABDOMINAL HYSTERECTOMY     14-15 years ago  . EYE SURGERY     "1 eye about 15 years ago; other eye about 10 years ago"  . REVERSE SHOULDER ARTHROPLASTY Left 09/29/2018  . REVERSE SHOULDER ARTHROPLASTY Left 09/29/2018   Procedure: left shoulder hardware removal, conversion to Reverse shoulder arthroplasty;  Surgeon: Justice Britain, MD;  Location: [facility];  Service: Orthopedics;  Laterality: Left;  13min Please move 7:30 case to 10:30am  . SHOULDER SURGERY  02/2017  . TONSILLECTOMY     removed at age 7-23     OB History   No obstetric history on file.     No family history on  file.  Social History   Tobacco Use  . Smoking status: Never Smoker  . Smokeless tobacco: Never Used  Vaping Use  . Vaping Use: Never used  Substance Use Topics  . Alcohol use: Never  . Drug use: Never    Home Medications Prior to Admission medications   Medication Sig Start Date End Date Taking? Authorizing Provider  ALPHAGAN P 0.1 % SOLN Apply 1 drop to eye 2 (two) times daily. 06/27/20  Yes [provider]  amLODipine (NORVASC) 5 MG tablet Take 5 mg by mouth daily.   Yes [provider]  dorzolamide-timolol (COSOPT) 22.3-6.8 MG/ML ophthalmic solution Place 1 drop into both eyes 2 (two) times daily.   Yes [provider]  furosemide (LASIX) 20 MG tablet Take 10 mg by mouth every other day.  04/27/20  Yes [provider]  latanoprost (XALATAN) 0.005 % ophthalmic solution Place 1 drop into both eyes at bedtime.   Yes [provider]  levothyroxine (SYNTHROID, LEVOTHROID) 50 MCG tablet Take 50 mcg by mouth daily.    Yes [provider]  losartan (COZAAR) 50 MG tablet Take 50 mg by mouth daily.   Yes [provider]  Multiple Vitamins-Minerals (MULTIVITAMIN WITH MINERALS) tablet Take 1 tablet by mouth daily.   Yes [provider]  spironolactone (ALDACTONE) 25 MG tablet Take 25 mg by mouth daily.   Yes [provider]  acetaminophen (TYLENOL) 325 MG  tablet Take 1-2 tablets (325-650 mg total) by mouth every 6 (six) hours as needed for mild pain (pain score 1-3 or temp > 100.5). Patient not taking: Reported on 08/27/2020 09/30/18   Shuford, Olivia Mackie, PA-C  clindamycin (CLEOCIN) 300 MG capsule Take 1 capsule (300 mg total) by mouth 3 (three) times daily for 7 days. 08/27/20 09/03/20  Keymora Grillot A, PA-C  traMADol (ULTRAM) 50 MG tablet Take 1 tablet (50 mg total) by mouth every 6 (six) hours as needed (prn mild mod pain, pain score 4-6). Patient not taking: Reported on 08/27/2020 09/30/18   Shuford, Olivia Mackie, PA-C     Allergies    Patient has no known allergies.  Review of Systems   Review of Systems  Constitutional: Negative.   HENT: Negative.   Respiratory: Negative.   Cardiovascular: Negative.   Gastrointestinal: Negative.   Genitourinary: Negative.   Musculoskeletal: Negative.   Skin: Positive for wound.  Neurological: Negative.   All other systems reviewed and are negative.  Physical Exam Updated Vital Signs BP (!) 156/58   Pulse 81   Temp 98.1 F (36.7 C) (Oral)   Resp 20   SpO2 98%   Physical Exam Vitals and nursing note reviewed.  Constitutional:      General: She is not in acute distress.    Appearance: She is well-developed. She is not ill-appearing, toxic-appearing or diaphoretic.  HENT:     Head: Normocephalic. Laceration present. No raccoon eyes, Battle's sign, abrasion or contusion.     Jaw: There is normal jaw occlusion.     Comments: No drooling, dysphagia or trismus.    Right Ear: No hemotympanum.     Left Ear: No hemotympanum.     Nose: Signs of injury and nasal tenderness present.     Right Nostril: Epistaxis present. No foreign body, septal hematoma or occlusion.     Left Nostril: No foreign body, epistaxis, septal hematoma or occlusion.     Comments: Epistaxis from right nares.  No evidence of septal hematoma.  Soft tissue swelling to bridge of nose.  Superficial laceration to inner nares on right small ooze of blood extends superficially into septum    Mouth/Throat:     Lips: Pink.     Mouth: Injury and lacerations present. No oral lesions.     Dentition: No dental tenderness.     Pharynx: Oropharynx is clear. Uvula midline.      Comments: Dentition intact.  Tongue midline.  No laceration or traumatic injury to tongue.  2.5 cm laceration to gingival, buccal mucosa border.  Mild oozing of bleeding.  Additional 2 cm laceration to inner lip as well as 2 cm laceration to exterior lip, jagged in nature.  Through and through laceration. Impacted incisor to RU  dentition with laceration to gingiva and exposed tooth. Eyes:     Extraocular Movements: Extraocular movements intact.     Conjunctiva/sclera: Conjunctivae normal.     Pupils: Pupils are equal, round, and reactive to light.     Visual Fields: Right eye visual fields normal and left eye visual fields normal.     Comments: EOM intact  Neck:     Trachea: Trachea and phonation normal.     Comments: No midline cervical tenderness, crepitus or step offs. Cardiovascular:     Rate and Rhythm: Normal rate.     Pulses: Normal pulses.          Radial pulses are 2+ on the right side and 2+ on the left side.  Dorsalis pedis pulses are 2+ on the right side and 2+ on the left side.       Posterior tibial pulses are 2+ on the right side and 2+ on the left side.     Heart sounds: Normal heart sounds.  Pulmonary:     Effort: Pulmonary effort is normal. No respiratory distress.     Breath sounds: Normal breath sounds and air entry.     Comments: Speaks in full sentences without difficulty. Lungs clear to auscultation. Chest:     Comments: Equal rise and fall to chest wall. No crepitus, stepoffs or tenderness Abdominal:     General: There is no distension.     Palpations: Abdomen is soft.     Tenderness: There is no abdominal tenderness. There is no right CVA tenderness, left CVA tenderness, guarding or rebound.     Hernia: No hernia is present.     Comments: Soft, nontender without rebound or guarding  Musculoskeletal:        General: Normal range of motion.     Cervical back: Full passive range of motion without pain and normal range of motion.     Comments: Spine nontender without crepitus or step-offs.  Pelvis stable, nontender palpation.  Full range of motion to bilateral upper and lower extremities without difficulty.  She does have some mild tenderness at her right olecranon.  Mild soft tissue swelling.  She has no bony tenderness to bilateral shoulders, humerus, tibia or fibula.  Full range  of motion to bilateral wrist with pronation and supination.  Equal handgrip bilaterally  Lymphadenopathy:     Cervical: No cervical adenopathy.  Skin:    General: Skin is warm and dry.     Capillary Refill: Capillary refill takes less than 2 seconds.     Findings: Laceration present.     Comments: # 3  Lacerations varying size, jagged.  Slow ozz of bleeding. See picture in chart  Neurological:     General: No focal deficit present.     Mental Status: She is alert.     Cranial Nerves: Cranial nerves are intact.     Comments: CN 2-12 grossly intact. Intact sensation to BL upper and lower extremities 5/5 strength Negative finger to nose, heel to shin Alert and oriented to person, place and time  Psychiatric:        Behavior: Behavior is cooperative.           ED Results / Procedures / Treatments   Labs (all labs ordered are listed, but only abnormal results are displayed) Labs Reviewed - No data to display  EKG None  Radiology DG Chest 2 View  Result Date: 08/27/2020 CLINICAL DATA:  [place], right elbow bruising EXAM: CHEST - 2 VIEW COMPARISON:  None. FINDINGS: Frontal and lateral views of the chest demonstrate an unremarkable cardiac silhouette. No airspace disease, effusion, or pneumothorax. No acute bony abnormalities. Unremarkable left shoulder arthroplasty. IMPRESSION: 1. No acute intrathoracic process. Electronically Signed   By: Randa Ngo M.D.   On: 08/27/2020 17:21   DG Pelvis 1-2 Views  Result Date: 08/27/2020 CLINICAL DATA:  Bruising, fell EXAM: PELVIS - 1-2 VIEW COMPARISON:  None. FINDINGS: Single frontal view of the pelvis demonstrates no acute fracture. The hips are well aligned. Joint spaces are well preserved. Sacroiliac joints are normal. IMPRESSION: 1. Unremarkable pelvis. Electronically Signed   By: Randa Ngo M.D.   On: 08/27/2020 17:19   DG Elbow Complete Right  Result Date: 08/27/2020 CLINICAL DATA:  Fell, bruising and laceration EXAM: RIGHT  ELBOW - COMPLETE 3+ VIEW COMPARISON:  None. FINDINGS: Frontal, bilateral oblique, lateral views of the right elbow are obtained. No fracture, subluxation, or dislocation. Joint spaces are well preserved. No joint effusion. IMPRESSION: 1. Unremarkable right elbow. Electronically Signed   By: Randa Ngo M.D.   On: 08/27/2020 17:20   CT Head Wo Contrast  Result Date: 08/27/2020 CLINICAL DATA:  Status post trauma. EXAM: CT HEAD WITHOUT CONTRAST TECHNIQUE: Contiguous axial images were obtained from the base of the skull through the vertex without intravenous contrast. COMPARISON:  None. FINDINGS: Brain: There is mild cerebral atrophy with widening of the extra-axial spaces and ventricular dilatation. There are areas of decreased attenuation within the white matter tracts of the supratentorial brain, consistent with microvascular disease changes. Vascular: No hyperdense vessel or unexpected calcification. Skull: Normal. Negative for fracture or focal lesion. Sinuses/Orbits: No acute finding. Other: There is mild left frontal scalp soft tissue swelling. IMPRESSION: Mild left frontal scalp soft tissue swelling without evidence of an acute fracture or acute intracranial abnormality. Electronically Signed   By: Virgina Norfolk M.D.   On: 08/27/2020 17:05   CT Cervical Spine Wo Contrast  Result Date: 08/27/2020 CLINICAL DATA:  Status post trauma. EXAM: CT CERVICAL SPINE WITHOUT CONTRAST TECHNIQUE: Multidetector CT imaging of the cervical spine was performed without intravenous contrast. Multiplanar CT image reconstructions were also generated. COMPARISON:  None. FINDINGS: Alignment: Normal. Skull base and vertebrae: No acute fracture. No primary bone lesion or focal pathologic process. Soft tissues and spinal canal: No prevertebral fluid or swelling. No visible canal hematoma. Disc levels: Mild to moderate severity endplate sclerosis is seen at the levels of C3-C4, C4-C5 and C5-C6. Mild endplate sclerosis is  noted at the level of C2-C3 and C6-C7. Mild to moderate severity multilevel intervertebral disc space narrowing is seen with moderate to marked severity intervertebral disc space narrowing noted at the level of C3-C4. Mild to moderate severity bilateral multilevel facet joint hypertrophy is noted. Upper chest: Negative. Other: None. IMPRESSION: 1. No acute osseous abnormality. 2. Mild to moderate severity multilevel degenerative disc disease and facet joint hypertrophy. Electronically Signed   By: Virgina Norfolk M.D.   On: 08/27/2020 17:19   CT Maxillofacial Wo Contrast  Result Date: 08/27/2020 CLINICAL DATA:  Status post trauma. EXAM: CT MAXILLOFACIAL WITHOUT CONTRAST TECHNIQUE: Multidetector CT imaging of the maxillofacial structures was performed. Multiplanar CT image reconstructions were also generated. COMPARISON:  None. FINDINGS: Osseous: There is displacement of a right lateral incisor with an associated fracture of the alveolar process of the right maxillary bone (axial CT images 47 through 51, CT series number 4). Orbits: Negative. No traumatic or inflammatory finding. Sinuses: Clear. Soft tissues: There is mild left frontal scalp soft tissue swelling. Mild anteromedial right facial soft tissue swelling is also seen Limited intracranial: No significant or unexpected finding. IMPRESSION: 1. Acute fracture of the alveolar process of the right maxillary bone with associated displacement of the right lateral incisor. 2. Mild left frontal and right facial soft tissue swelling. Electronically Signed   By: Virgina Norfolk M.D.   On: 08/27/2020 17:14    Procedures .Marland KitchenLaceration Repair  Date/Time: 08/27/2020 9:24 PM Performed by: Nettie Elm, PA-C Authorized by: Nettie Elm, PA-C   Consent:    Consent obtained:  Verbal   Consent given by:  Patient   Risks discussed:  Infection, need for additional repair, pain, poor cosmetic result and poor wound healing   Alternatives discussed:  No treatment, delayed treatment, observation and referral Universal protocol:    Procedure explained and questions answered to patient or proxy's satisfaction: yes     Relevant documents present and verified: yes     Test results available and properly labeled: yes     Imaging studies available: yes     Required blood products, implants, devices, and special equipment available: yes     Site/side marked: yes     Immediately prior to procedure, a time out was called: yes     Patient identity confirmed:  Verbally with patient Anesthesia (see MAR for exact dosages):    Anesthesia method:  None Laceration details:    Location:  Face   Face location:  Nose   Length (cm):  0.3 Repair type:    Repair type:  Simple Pre-procedure details:    Preparation:  Imaging obtained to evaluate for foreign bodies and patient was prepped and draped in usual sterile fashion Exploration:    Hemostasis achieved with:  Direct pressure   Wound exploration: wound explored through full range of motion and entire depth of wound probed and visualized     Contaminated: no   Treatment:    Area cleansed with:  Betadine   Amount of cleaning:  Extensive   Irrigation solution:  Sterile saline   Irrigation method:  Pressure wash Skin repair:    Repair method:  Tissue adhesive Approximation:    Approximation:  Close Post-procedure details:    Dressing:  Open (no dressing)   Patient tolerance of procedure:  Tolerated well, no immediate complications  .Marland KitchenLaceration Repair  Date/Time: 08/27/2020 10:09 PM Performed by: Nettie Elm, PA-C Authorized by: Nettie Elm, PA-C   Consent:    Consent obtained:  Verbal   Consent given by:  Patient   Risks discussed:  Infection, pain, retained foreign body, tendon damage, vascular damage, poor wound healing, poor cosmetic result, nerve damage and need for additional repair   Alternatives discussed:  No treatment, delayed treatment, observation and  referral Anesthesia (see MAR for exact dosages):    Anesthesia method:  None Laceration details:    Location:  Lip   Lip location:  Lower lip, full thickness   Height of lip laceration:  Up to half vertical height   Length (cm):  2.5   Depth (mm):  10 Repair type:    Repair type:  Complex Pre-procedure details:    Preparation:  Patient was prepped and draped in usual sterile fashion and imaging obtained to evaluate for foreign bodies Exploration:    Hemostasis achieved with:  Direct pressure and epinephrine   Contaminated: no   Treatment:    Area cleansed with:  Saline   Amount of cleaning:  Extensive   Irrigation solution:  Sterile saline   Irrigation method:  Pressure wash   Visualized foreign bodies/material removed: no     Debridement:  Moderate   Undermining:  Minimal   Scar revision: no   Skin repair:    Repair method:  Sutures   Suture size:  6-0   Suture material:  Prolene   Suture technique:  Simple interrupted   Number of sutures:  7 Approximation:    Approximation:  Close Post-procedure details:    Dressing:  Open (no dressing)   Patient tolerance of procedure:  Tolerated well, no immediate complications .Marland KitchenLaceration Repair  Date/Time: 08/27/2020 10:12 PM Performed by: Nettie Elm, PA-C Authorized by: Nettie Elm, PA-C   Consent:    Consent obtained:  Verbal   Consent given by:  Patient   Risks discussed:  Infection, pain, retained foreign body, poor cosmetic result, need for additional repair, tendon damage, nerve damage, poor wound healing and vascular damage   Alternatives discussed:  No treatment, delayed treatment, referral and observation Anesthesia (see MAR for exact dosages):    Anesthesia method:  Local infiltration   Local anesthetic:  Lidocaine 1% w/o epi Laceration details:    Location:  Lip   Lip location:  Lower interior lip   Length (cm):  2.5   Depth (mm):  5 Repair type:    Repair type:  Intermediate Pre-procedure  details:    Preparation:  Patient was prepped and draped in usual sterile fashion and imaging obtained to evaluate for foreign bodies Exploration:    Hemostasis achieved with:  Direct pressure   Wound exploration: wound explored through full range of motion and entire depth of wound probed and visualized     Contaminated: no   Treatment:    Amount of cleaning:  Extensive   Irrigation solution:  Sterile saline   Irrigation method:  Pressure wash Skin repair:    Repair method:  Sutures   Suture size:  6-0   Suture material:  Chromic gut   Suture technique:  Simple interrupted   Number of sutures:  4 Approximation:    Approximation:  Close Post-procedure details:    Dressing:  Open (no dressing)   Patient tolerance of procedure:  Tolerated well, no immediate complications .Epistaxis Management  Date/Time: 08/27/2020 10:13 PM Performed by: Nettie Elm, PA-C Authorized by: Nettie Elm, PA-C   Consent:    Consent obtained:  Verbal   Consent given by:  Patient   Risks discussed:  Bleeding, nasal injury, pain and infection   Alternatives discussed:  No treatment, delayed treatment, alternative treatment, observation and referral Anesthesia (see MAR for exact dosages):    Anesthesia method:  None Procedure details:    Treatment site:  R anterior   Treatment method:  Silver nitrate   Treatment complexity:  Limited   Treatment episode: initial   Post-procedure details:    Assessment:  No improvement   Patient tolerance of procedure:  Tolerated well, no immediate complications Wound repair  Date/Time: 08/27/2020 10:16 PM Performed by: Nettie Elm, PA-C Authorized by: Nettie Elm, PA-C  Preparation: Patient was prepped and draped in the usual sterile fashion. Local anesthesia used: no  Anesthesia: Local anesthesia used: no  Sedation: Patient sedated: no  Patient tolerance: patient tolerated the procedure well with no immediate complications     (including critical care time)  Medications Ordered in ED Medications  lidocaine-EPINEPHrine (XYLOCAINE W/EPI) 2 %-1:200000 (PF) injection 10 mL (10 mLs Infiltration Not Given 08/27/20 2004)  oxymetazoline (AFRIN) 0.05 % nasal spray 1 spray (1 spray Each Nare Given 08/27/20 1721)  Tdap (BOOSTRIX) injection 0.5 mL (0.5 mLs Intramuscular Given 08/27/20 1721)  HYDROcodone-acetaminophen (NORCO/VICODIN) 5-325 MG per tablet 1 tablet (1 tablet Oral Given 08/27/20 1721)  lidocaine (PF) (XYLOCAINE) 1 % injection 30 mL (30 mLs Infiltration Given by Other 08/27/20 1911)  lidocaine-EPINEPHrine (XYLOCAINE W/EPI) 2 %-1:100000 (with pres) injection (  Given by Other 08/27/20 2029)  tranexamic acid (CYKLOKAPRON) 1000 MG/10ML topical solution 500 mg (500 mg Topical Given 08/27/20 2025)    ED Course  I have reviewed the triage vital signs and the nursing notes.  Pertinent labs & imaging results that were available during my care of the patient were reviewed by me and  considered in my medical decision making (see chart for details).  86 year old female appears otherwise well presents for evaluation after mechanical fall which occurred just PTA.  She is afebrile, nonseptic, not ill-appearing.  She is neurovascularly intact.  She had no headache, lightheadedness, dizziness, chest pain, shortness of breath or paresthesias prior to fall.  She has been ambulatory since the incident.  Does have some tenderness to right olecranon with some overlying soft tissue swelling.  Patient with #3 lacerations to the inner and outer mucosa to lip as well has an inner mucosal gingival border.  Dentition appears intact.  Her tongue is not any evidence of traumatic injuries.  She does have mild slow ooze of epistaxis to right nares.  No evidence of septal hematoma.  Some soft tissue swelling over nasal bridge.  Plan on imaging and reassess. Tetanus updated today.  Imaging personally reviewed and interpreted:  CT head without acute  findings CT cervical without acute findings CT head with impacted incisor with acute fracture of alveolar DG pelvis without acute fracture DG elbow without acute fracture, dislocation DG chest without acute pathology  CONSULT with Dr. Marla Roe, recommends consulting dentistry given impacted incisor. Request return call after dentistry assess patient.  Attempt to CONSULT Dr. Tamala Julian with dentistry on-call.  I attempted to call x3.  Left voice message without return of phone call.  CONSULT with Dr. Marla Roe recommends consulting oral surgery or dentistry at outside facility to see if she needs surgical intervention.  CONSULT with Dr. Vernona Rieger with [facility] facial trauma. He does not think based off CT read that patient needs emergent surgery tonight or transfer for evaluation. Likely needs surgery however can be performed on outpatient basis. No trismus on exam and tolerating PO intake without difficulty in ED. [facility] recommends PO Abx, pain control, soft diet and follow up with Max/face surgery locally in the next 1-2 days for evaluation for dental surgery.  Patient's multiple lacerations sutured. See procedure notes. She has # 7 sutures to her external lip.  Did place # 3 sutures to inner mucosa due to gaping wound.  Also single suture placed to gingival mucosa border due to gaping.  Patient's alveolar fracture unfortunately continued to ooze blood.  Suture was placed to laceration however continued bleed. Multiple measures to stop bleeding were performed including pressure, tea bag, lidocaine with epi.  Fortunately TXA soaked gauze was able to clot patient's bleed. Discussed with patient soft diet, rinsing with water. She will follow-up with oral surgery/ dentistry tomorrow and plastic surgery in 5 days. Discussed plan with patient and family in room. Insturctions given for assisted facility. Patient ambulatory without difficulty.   The patient has been appropriately medically screened and/or  stabilized in the ED. I have low suspicion for any other emergent medical condition which would require further screening, evaluation or treatment in the ED or require inpatient management.  Patient is hemodynamically stable and in no acute distress.  Patient able to ambulate in department prior to ED.  Evaluation does not show acute pathology that would require ongoing or additional emergent interventions while in the emergency department or further inpatient treatment.  I have discussed the diagnosis with the patient and answered all questions.  Pain is been managed while in the emergency department and patient has no further complaints prior to discharge.  Patient is comfortable with plan discussed in room and is stable for discharge at this time.  I have discussed strict return precautions for returning to the emergency department.  Patient was encouraged to follow-up with PCP/specialist refer to at discharge.   Patient seen eval by attending, Dr. Tamera Punt who agrees with treatment, plan and disposition    MDM Rules/Calculators/A&P                           Final Clinical Impression(s) / ED Diagnoses Final diagnoses:  Fall, initial encounter  Laceration of nose, initial encounter  Lip laceration, initial encounter  Open fracture of alveolar process of maxilla, initial encounter [facility])    Rx / DC Orders ED Discharge Orders         Ordered    clindamycin (CLEOCIN) 300 MG capsule  3 times daily        08/27/20 2120           Alyxandra Tenbrink A, PA-C 08/27/20 2224    Malvin Johns, MD 08/27/20 2245

## 2020-08-27 NOTE — ED Triage Notes (Signed)
C/o ground level fall denies dizziness prior to fall. Denies blood thinners. Laceration to lower lip, through and through. Small laceration to bridge of nose, blood noted in nasal cavity.

## 2020-08-27 NOTE — Discharge Instructions (Signed)
Follow-up with oral surgery and dentistry tomorrow.  Call them to schedule an appointment.  Sutures to the outer lip to be removed in 5 to 7 days.  You may follow-up with plastic surgery for this.  Her name is Dr. Elisabeth Cara.  You will need to call to schedule an appointment.  Your inner sutures will dissolve.  Soft diet over the next week.  Rinse with water, swish and spit anytime you eat or drink anything other than water  If bleeding begins to hold pressure with a gauze to the area.  Return for new or worsening symptoms.

## 2020-09-02 DIAGNOSIS — M859 Disorder of bone density and structure, unspecified: Secondary | ICD-10-CM | POA: Diagnosis not present

## 2020-09-02 DIAGNOSIS — I129 Hypertensive chronic kidney disease with stage 1 through stage 4 chronic kidney disease, or unspecified chronic kidney disease: Secondary | ICD-10-CM | POA: Diagnosis not present

## 2020-09-02 DIAGNOSIS — E7800 Pure hypercholesterolemia, unspecified: Secondary | ICD-10-CM | POA: Diagnosis not present

## 2020-09-02 DIAGNOSIS — E039 Hypothyroidism, unspecified: Secondary | ICD-10-CM | POA: Diagnosis not present

## 2020-09-04 DIAGNOSIS — W19XXXD Unspecified fall, subsequent encounter: Secondary | ICD-10-CM | POA: Diagnosis not present

## 2020-09-04 DIAGNOSIS — Z23 Encounter for immunization: Secondary | ICD-10-CM | POA: Diagnosis not present

## 2020-09-04 DIAGNOSIS — R82998 Other abnormal findings in urine: Secondary | ICD-10-CM | POA: Diagnosis not present

## 2020-09-04 DIAGNOSIS — S01511D Laceration without foreign body of lip, subsequent encounter: Secondary | ICD-10-CM | POA: Diagnosis not present

## 2020-09-09 DIAGNOSIS — Z20828 Contact with and (suspected) exposure to other viral communicable diseases: Secondary | ICD-10-CM | POA: Diagnosis not present

## 2020-09-09 DIAGNOSIS — E7800 Pure hypercholesterolemia, unspecified: Secondary | ICD-10-CM | POA: Diagnosis not present

## 2020-09-09 DIAGNOSIS — H409 Unspecified glaucoma: Secondary | ICD-10-CM | POA: Diagnosis not present

## 2020-09-09 DIAGNOSIS — M859 Disorder of bone density and structure, unspecified: Secondary | ICD-10-CM | POA: Diagnosis not present

## 2020-09-09 DIAGNOSIS — R809 Proteinuria, unspecified: Secondary | ICD-10-CM | POA: Diagnosis not present

## 2020-09-09 DIAGNOSIS — Z1159 Encounter for screening for other viral diseases: Secondary | ICD-10-CM | POA: Diagnosis not present

## 2020-09-09 DIAGNOSIS — E039 Hypothyroidism, unspecified: Secondary | ICD-10-CM | POA: Diagnosis not present

## 2020-09-09 DIAGNOSIS — R600 Localized edema: Secondary | ICD-10-CM | POA: Diagnosis not present

## 2020-09-09 DIAGNOSIS — N1832 Chronic kidney disease, stage 3b: Secondary | ICD-10-CM | POA: Diagnosis not present

## 2020-09-09 DIAGNOSIS — I129 Hypertensive chronic kidney disease with stage 1 through stage 4 chronic kidney disease, or unspecified chronic kidney disease: Secondary | ICD-10-CM | POA: Diagnosis not present

## 2020-09-09 DIAGNOSIS — I8390 Asymptomatic varicose veins of unspecified lower extremity: Secondary | ICD-10-CM | POA: Diagnosis not present

## 2020-09-09 DIAGNOSIS — Z0000 Encounter for general adult medical examination without abnormal findings: Secondary | ICD-10-CM | POA: Diagnosis not present

## 2020-09-09 DIAGNOSIS — H259 Unspecified age-related cataract: Secondary | ICD-10-CM | POA: Diagnosis not present

## 2020-09-16 DIAGNOSIS — Z20828 Contact with and (suspected) exposure to other viral communicable diseases: Secondary | ICD-10-CM | POA: Diagnosis not present

## 2020-09-16 DIAGNOSIS — Z1159 Encounter for screening for other viral diseases: Secondary | ICD-10-CM | POA: Diagnosis not present

## 2020-09-23 DIAGNOSIS — Z20828 Contact with and (suspected) exposure to other viral communicable diseases: Secondary | ICD-10-CM | POA: Diagnosis not present

## 2020-09-23 DIAGNOSIS — Z1159 Encounter for screening for other viral diseases: Secondary | ICD-10-CM | POA: Diagnosis not present

## 2020-09-25 DIAGNOSIS — H401131 Primary open-angle glaucoma, bilateral, mild stage: Secondary | ICD-10-CM | POA: Diagnosis not present

## 2020-09-25 DIAGNOSIS — H43812 Vitreous degeneration, left eye: Secondary | ICD-10-CM | POA: Diagnosis not present

## 2020-09-25 DIAGNOSIS — Z961 Presence of intraocular lens: Secondary | ICD-10-CM | POA: Diagnosis not present

## 2020-09-30 DIAGNOSIS — Z20828 Contact with and (suspected) exposure to other viral communicable diseases: Secondary | ICD-10-CM | POA: Diagnosis not present

## 2020-09-30 DIAGNOSIS — Z1159 Encounter for screening for other viral diseases: Secondary | ICD-10-CM | POA: Diagnosis not present

## 2020-10-07 DIAGNOSIS — Z1159 Encounter for screening for other viral diseases: Secondary | ICD-10-CM | POA: Diagnosis not present

## 2020-10-07 DIAGNOSIS — Z20828 Contact with and (suspected) exposure to other viral communicable diseases: Secondary | ICD-10-CM | POA: Diagnosis not present

## 2020-10-14 DIAGNOSIS — Z20828 Contact with and (suspected) exposure to other viral communicable diseases: Secondary | ICD-10-CM | POA: Diagnosis not present

## 2020-10-14 DIAGNOSIS — Z1159 Encounter for screening for other viral diseases: Secondary | ICD-10-CM | POA: Diagnosis not present

## 2020-10-18 DIAGNOSIS — Z23 Encounter for immunization: Secondary | ICD-10-CM | POA: Diagnosis not present

## 2020-10-21 DIAGNOSIS — Z20828 Contact with and (suspected) exposure to other viral communicable diseases: Secondary | ICD-10-CM | POA: Diagnosis not present

## 2020-10-21 DIAGNOSIS — Z1159 Encounter for screening for other viral diseases: Secondary | ICD-10-CM | POA: Diagnosis not present

## 2020-10-28 DIAGNOSIS — Z20828 Contact with and (suspected) exposure to other viral communicable diseases: Secondary | ICD-10-CM | POA: Diagnosis not present

## 2020-10-28 DIAGNOSIS — Z1159 Encounter for screening for other viral diseases: Secondary | ICD-10-CM | POA: Diagnosis not present

## 2020-11-04 DIAGNOSIS — Z20828 Contact with and (suspected) exposure to other viral communicable diseases: Secondary | ICD-10-CM | POA: Diagnosis not present

## 2020-11-04 DIAGNOSIS — Z1159 Encounter for screening for other viral diseases: Secondary | ICD-10-CM | POA: Diagnosis not present

## 2020-11-11 DIAGNOSIS — Z20828 Contact with and (suspected) exposure to other viral communicable diseases: Secondary | ICD-10-CM | POA: Diagnosis not present

## 2020-11-11 DIAGNOSIS — Z1159 Encounter for screening for other viral diseases: Secondary | ICD-10-CM | POA: Diagnosis not present

## 2020-11-18 DIAGNOSIS — Z1159 Encounter for screening for other viral diseases: Secondary | ICD-10-CM | POA: Diagnosis not present

## 2020-11-18 DIAGNOSIS — Z20828 Contact with and (suspected) exposure to other viral communicable diseases: Secondary | ICD-10-CM | POA: Diagnosis not present

## 2020-11-25 DIAGNOSIS — Z1159 Encounter for screening for other viral diseases: Secondary | ICD-10-CM | POA: Diagnosis not present

## 2020-11-25 DIAGNOSIS — Z20828 Contact with and (suspected) exposure to other viral communicable diseases: Secondary | ICD-10-CM | POA: Diagnosis not present

## 2020-11-27 ENCOUNTER — Emergency Department (HOSPITAL_COMMUNITY): Payer: Medicare Other

## 2020-11-27 ENCOUNTER — Observation Stay (HOSPITAL_COMMUNITY)
Admission: EM | Admit: 2020-11-27 | Discharge: 2020-11-28 | Disposition: A | Discharge status: Home / Self Care | Payer: Medicare Other | Attending: Family Medicine | Admitting: Family Medicine

## 2020-11-27 DIAGNOSIS — Z79899 Other long term (current) drug therapy: Secondary | ICD-10-CM | POA: Diagnosis not present

## 2020-11-27 DIAGNOSIS — W1839XA Other fall on same level, initial encounter: Secondary | ICD-10-CM | POA: Diagnosis not present

## 2020-11-27 DIAGNOSIS — S0181XA Laceration without foreign body of other part of head, initial encounter: Secondary | ICD-10-CM | POA: Diagnosis not present

## 2020-11-27 DIAGNOSIS — S0990XA Unspecified injury of head, initial encounter: Secondary | ICD-10-CM | POA: Diagnosis present

## 2020-11-27 DIAGNOSIS — S2249XA Multiple fractures of ribs, unspecified side, initial encounter for closed fracture: Secondary | ICD-10-CM

## 2020-11-27 DIAGNOSIS — Y92838 Other recreation area as the place of occurrence of the external cause: Secondary | ICD-10-CM | POA: Insufficient documentation

## 2020-11-27 DIAGNOSIS — R110 Nausea: Secondary | ICD-10-CM | POA: Diagnosis not present

## 2020-11-27 DIAGNOSIS — Z043 Encounter for examination and observation following other accident: Secondary | ICD-10-CM | POA: Diagnosis not present

## 2020-11-27 DIAGNOSIS — G935 Compression of brain: Secondary | ICD-10-CM | POA: Diagnosis not present

## 2020-11-27 DIAGNOSIS — R0781 Pleurodynia: Secondary | ICD-10-CM | POA: Diagnosis not present

## 2020-11-27 DIAGNOSIS — S065XAA Traumatic subdural hemorrhage with loss of consciousness status unknown, initial encounter: Secondary | ICD-10-CM | POA: Diagnosis present

## 2020-11-27 DIAGNOSIS — I10 Essential (primary) hypertension: Secondary | ICD-10-CM | POA: Diagnosis not present

## 2020-11-27 DIAGNOSIS — W1830XA Fall on same level, unspecified, initial encounter: Secondary | ICD-10-CM | POA: Diagnosis not present

## 2020-11-27 DIAGNOSIS — S065X9A Traumatic subdural hemorrhage with loss of consciousness of unspecified duration, initial encounter: Secondary | ICD-10-CM

## 2020-11-27 DIAGNOSIS — S065X0A Traumatic subdural hemorrhage without loss of consciousness, initial encounter: Principal | ICD-10-CM | POA: Insufficient documentation

## 2020-11-27 DIAGNOSIS — R55 Syncope and collapse: Secondary | ICD-10-CM | POA: Insufficient documentation

## 2020-11-27 DIAGNOSIS — S01111A Laceration without foreign body of right eyelid and periocular area, initial encounter: Secondary | ICD-10-CM | POA: Diagnosis not present

## 2020-11-27 DIAGNOSIS — S2241XA Multiple fractures of ribs, right side, initial encounter for closed fracture: Secondary | ICD-10-CM | POA: Insufficient documentation

## 2020-11-27 DIAGNOSIS — S0003XA Contusion of scalp, initial encounter: Secondary | ICD-10-CM | POA: Diagnosis not present

## 2020-11-27 DIAGNOSIS — S0011XA Contusion of right eyelid and periocular area, initial encounter: Secondary | ICD-10-CM | POA: Diagnosis not present

## 2020-11-27 DIAGNOSIS — Z20822 Contact with and (suspected) exposure to covid-19: Secondary | ICD-10-CM | POA: Diagnosis not present

## 2020-11-27 DIAGNOSIS — W19XXXA Unspecified fall, initial encounter: Secondary | ICD-10-CM | POA: Diagnosis not present

## 2020-11-27 DIAGNOSIS — S0511XA Contusion of eyeball and orbital tissues, right eye, initial encounter: Secondary | ICD-10-CM | POA: Diagnosis not present

## 2020-11-27 DIAGNOSIS — S199XXA Unspecified injury of neck, initial encounter: Secondary | ICD-10-CM | POA: Diagnosis not present

## 2020-11-27 DIAGNOSIS — G4489 Other headache syndrome: Secondary | ICD-10-CM | POA: Diagnosis not present

## 2020-11-27 LAB — URINALYSIS, ROUTINE W REFLEX MICROSCOPIC
Bilirubin Urine: NEGATIVE
Glucose, UA: NEGATIVE mg/dL
Hgb urine dipstick: NEGATIVE
Ketones, ur: 5 mg/dL — AB
Leukocytes,Ua: NEGATIVE
Nitrite: NEGATIVE
Protein, ur: NEGATIVE mg/dL
Specific Gravity, Urine: 1.013 (ref 1.005–1.030)
pH: 7 (ref 5.0–8.0)

## 2020-11-27 LAB — COMPREHENSIVE METABOLIC PANEL
ALT: 14 U/L (ref 0–44)
AST: 21 U/L (ref 15–41)
Albumin: 3.7 g/dL (ref 3.5–5.0)
Alkaline Phosphatase: 52 U/L (ref 38–126)
Anion gap: 15 (ref 5–15)
BUN: 28 mg/dL — ABNORMAL HIGH (ref 8–23)
CO2: 21 mmol/L — ABNORMAL LOW (ref 22–32)
Calcium: 10.5 mg/dL — ABNORMAL HIGH (ref 8.9–10.3)
Chloride: 103 mmol/L (ref 98–111)
Creatinine, Ser: 1.21 mg/dL — ABNORMAL HIGH (ref 0.44–1.00)
GFR, Estimated: 42 mL/min — ABNORMAL LOW (ref >60)
Glucose, Bld: 116 mg/dL — ABNORMAL HIGH (ref 70–99)
Potassium: 4.7 mmol/L (ref 3.5–5.1)
Sodium: 139 mmol/L (ref 135–145)
Total Bilirubin: 1 mg/dL (ref 0.3–1.2)
Total Protein: 6.6 g/dL (ref 6.5–8.1)

## 2020-11-27 LAB — RESP PANEL BY RT-PCR (FLU A&B, COVID) ARPGX2
Influenza A by PCR: NEGATIVE
Influenza B by PCR: NEGATIVE
SARS Coronavirus 2 by RT PCR: NEGATIVE

## 2020-11-27 LAB — CBC WITH DIFFERENTIAL/PLATELET
Abs Immature Granulocytes: 0.05 10*3/uL (ref 0.00–0.07)
Basophils Absolute: 0.1 10*3/uL (ref 0.0–0.1)
Basophils Relative: 1 %
Eosinophils Absolute: 0.1 10*3/uL (ref 0.0–0.5)
Eosinophils Relative: 1 %
HCT: 39 % (ref 36.0–46.0)
Hemoglobin: 12.5 g/dL (ref 12.0–15.0)
Immature Granulocytes: 1 %
Lymphocytes Relative: 13 %
Lymphs Abs: 1.3 10*3/uL (ref 0.7–4.0)
MCH: 31.9 pg (ref 26.0–34.0)
MCHC: 32.1 g/dL (ref 30.0–36.0)
MCV: 99.5 fL (ref 80.0–100.0)
Monocytes Absolute: 0.6 10*3/uL (ref 0.1–1.0)
Monocytes Relative: 6 %
Neutro Abs: 7.7 10*3/uL (ref 1.7–7.7)
Neutrophils Relative %: 78 %
Platelets: 201 10*3/uL (ref 150–400)
RBC: 3.92 MIL/uL (ref 3.87–5.11)
RDW: 11.8 % (ref 11.5–15.5)
WBC: 9.8 10*3/uL (ref 4.0–10.5)
nRBC: 0 % (ref 0.0–0.2)

## 2020-11-27 LAB — TROPONIN I (HIGH SENSITIVITY)
Troponin I (High Sensitivity): 17 ng/L (ref <18)
Troponin I (High Sensitivity): 26 ng/L — ABNORMAL HIGH (ref <18)

## 2020-11-27 LAB — PROTIME-INR
INR: 1 (ref 0.8–1.2)
Prothrombin Time: 13 seconds (ref 11.4–15.2)

## 2020-11-27 MED ORDER — IOHEXOL 300 MG/ML  SOLN
75.0000 mL | Freq: Once | INTRAMUSCULAR | Status: AC | PRN
  Administered 2020-11-27: 75 mL via INTRAVENOUS

## 2020-11-27 MED ORDER — LIDOCAINE HCL (PF) 1 % IJ SOLN
5.0000 mL | Freq: Once | INTRAMUSCULAR | Status: AC
  Administered 2020-11-27: 17:00:00 5 mL
  Filled 2020-11-27: qty 5

## 2020-11-27 MED ORDER — LIDOCAINE 5 % EX PTCH
1.0000 | MEDICATED_PATCH | CUTANEOUS | Status: DC
  Administered 2020-11-27: 17:00:00 1 via TRANSDERMAL
  Filled 2020-11-27: qty 1

## 2020-11-27 MED ORDER — ACETAMINOPHEN 325 MG PO TABS: 325.0000 mg | ORAL_TABLET | Freq: Four times a day (QID) | ORAL | Status: DC | PRN

## 2020-11-27 MED ORDER — METOPROLOL TARTRATE 5 MG/5ML IV SOLN: 5.0000 mg | Freq: Four times a day (QID) | INTRAVENOUS | Status: DC | PRN

## 2020-11-27 MED ORDER — BRIMONIDINE TARTRATE 0.15 % OP SOLN
1.0000 [drp] | Freq: Two times a day (BID) | OPHTHALMIC | Status: DC
  Administered 2020-11-27 – 2020-11-28 (×2): 1 [drp] via OPHTHALMIC
  Filled 2020-11-27: qty 5

## 2020-11-27 MED ORDER — DORZOLAMIDE HCL-TIMOLOL MAL 2-0.5 % OP SOLN
1.0000 [drp] | Freq: Two times a day (BID) | OPHTHALMIC | Status: DC
  Administered 2020-11-27 – 2020-11-28 (×2): 1 [drp] via OPHTHALMIC
  Filled 2020-11-27: qty 10

## 2020-11-27 MED ORDER — LOSARTAN POTASSIUM 50 MG PO TABS
50.0000 mg | ORAL_TABLET | Freq: Every day | ORAL | Status: DC
  Administered 2020-11-27: 22:00:00 50 mg via ORAL
  Filled 2020-11-27 (×2): qty 1

## 2020-11-27 MED ORDER — ACETAMINOPHEN 325 MG PO TABS: 650.0000 mg | ORAL_TABLET | Freq: Four times a day (QID) | ORAL | Status: DC

## 2020-11-27 MED ORDER — AMLODIPINE BESYLATE 5 MG PO TABS
5.0000 mg | ORAL_TABLET | Freq: Every day | ORAL | Status: DC
  Administered 2020-11-27: 22:00:00 5 mg via ORAL
  Filled 2020-11-27 (×2): qty 1

## 2020-11-27 MED ORDER — METHOCARBAMOL 500 MG PO TABS
500.0000 mg | ORAL_TABLET | Freq: Three times a day (TID) | ORAL | Status: DC | PRN
  Administered 2020-11-27: 22:00:00 500 mg via ORAL
  Filled 2020-11-27: qty 1

## 2020-11-27 MED ORDER — TRAMADOL HCL 50 MG PO TABS
50.0000 mg | ORAL_TABLET | Freq: Four times a day (QID) | ORAL | Status: DC | PRN
Start: 2020-11-27 — End: 2020-11-28

## 2020-11-27 MED ORDER — ADULT MULTIVITAMIN W/MINERALS CH
1.0000 | ORAL_TABLET | Freq: Every day | ORAL | Status: DC
  Administered 2020-11-27 – 2020-11-28 (×2): 1 via ORAL
  Filled 2020-11-27 (×2): qty 1

## 2020-11-27 MED ORDER — LATANOPROST 0.005 % OP SOLN
1.0000 [drp] | Freq: Every day | OPHTHALMIC | Status: DC
  Administered 2020-11-27: 23:00:00 1 [drp] via OPHTHALMIC
  Filled 2020-11-27: qty 2.5

## 2020-11-27 MED ORDER — BACITRACIN ZINC 500 UNIT/GM EX OINT
TOPICAL_OINTMENT | Freq: Two times a day (BID) | CUTANEOUS | Status: DC
  Administered 2020-11-27 – 2020-11-28 (×2): 1 via TOPICAL
  Filled 2020-11-27: qty 0.9

## 2020-11-27 MED ORDER — MULTI-VITAMIN/MINERALS PO TABS: 1.0000 | ORAL_TABLET | Freq: Every day | ORAL | Status: DC

## 2020-11-27 MED ORDER — LEVOTHYROXINE SODIUM 50 MCG PO TABS
50.0000 ug | ORAL_TABLET | Freq: Every day | ORAL | Status: DC
  Administered 2020-11-27: 22:00:00 50 ug via ORAL
  Filled 2020-11-27: qty 1

## 2020-11-27 NOTE — ED Notes (Signed)
PT ambulated to restroom and was able to hover over toilet without difficulty or losing balance. She reported she feels weak but not dizzy.

## 2020-11-27 NOTE — ED Notes (Signed)
Patient transported to CT 

## 2020-11-27 NOTE — Consult Note (Signed)
Neurosurgery Consultation  Reason for Consult: Subdural hematom Referring Physician: Alvino Chapel  CC: Subdural hematoma  HPI: This is a 86 y.o. woman that presents after a syncopal episode resulting in a fall and striking her head. She denies any headache, just some tenderness around a right orbital ecchymosis. No new weakness, numbness, or parasthesias, no recent change in bowel or bladder function. No recent use of anti-platelet or anti-coagulant medications.   ROS: A 14 point ROS was performed and is negative except as noted in the HPI.   PMHx:  Past Medical History:  Diagnosis Date  . Dysrhythmia   . Hypertension   . PONV (postoperative nausea and vomiting)    FamHx: No family history on file. SocHx:  reports that she has never smoked. She has never used smokeless tobacco. She reports that she does not drink alcohol and does not use drugs.  Exam: Vital signs in last 24 hours: Temp:  [97.8 F (36.6 C)] 97.8 F (36.6 C) (12/22 1314) Pulse Rate:  [72] 72 (12/22 1314) Resp:  [14] 14 (12/22 1314) BP: (180)/(57) 180/57 (12/22 1314) SpO2:  [99 %] 99 % (12/22 1314) General: Awake, alert, cooperative, lying in bed in NAD Head: Normocephalic, +R periorbital ecchymosis HEENT: Neck supple Pulmonary: breathing room air comfortably, no evidence of increased work of breathing Cardiac: RRR Abdomen: S NT ND Extremities: Warm and well perfused x4, limited ROM in L shoulder Neuro: AOx3, PERRL, EOMI, FS Strength 5/5 x4 except limited at L shoulder 2/2 frozen shoulder, SILTx4   Assessment and Plan: 86 y.o. woman s/p syncopal fall. [place] personally reviewed, which shows right 21mm SDH with mild brain compression.   -no acute neurosurgical intervention indicated at this time -rpt [place] this morning, if stable then can be discharged home -follow up with me in clinic in 2 weeks  Judith Part, MD 11/27/20 4:30 PM [facility]

## 2020-11-27 NOTE — Consult Note (Signed)
[facility] Surgery Consult Note  Valerie Molina 1926/01/29  MR:[id number].    Requesting MD: Alvino Chapel, MD Chief Complaint/Reason for Consult: fall, rib fractures   HPI:  Valerie Molina is a 86 y/o F with a PMH HTN and dysrhythmia who presented to [facility] via EMS after a fall today. Patient was found down in an elevator at [place] where she resides. Patient is amnestic to the event, remembers walking to the elevator and then woke up with paramedics around her. Unsure if she tripped or if she had a syncopal event. Ambulatory after the event. States that she has mild chest pain is she takes a deep breath, otherwise no complaints. Denies headache, visual changes, neck pain, back pain, abdominal pain, SOB, or pain/weakness in BUE/BLE. Denies use of blood thinning medications. Denies recent changes in medications. She denies tobacco or alcohol use. Ambulates at baseline without assistive device.   ED workup significant for SDH, scalp hematoma, and rib fractures. Trauma has been asked to consult.  ROS: above Review of Systems  Constitutional: Negative.   HENT: Negative.   Respiratory: Negative for shortness of breath.   Cardiovascular: Positive for chest pain.  Gastrointestinal: Negative.   Musculoskeletal: Negative for back pain, joint pain and neck pain.  All other systems reviewed and are negative.  No family history on file.  Past Medical History:  Diagnosis Date  . Dysrhythmia   . Hypertension   . PONV (postoperative nausea and vomiting)     Past Surgical History:  Procedure Laterality Date  . ABDOMINAL HYSTERECTOMY     14-15 years ago  . EYE SURGERY     "1 eye about 15 years ago; other eye about 10 years ago"  . REVERSE SHOULDER ARTHROPLASTY Left 09/29/2018  . REVERSE SHOULDER ARTHROPLASTY Left 09/29/2018   Procedure: left shoulder hardware removal, conversion to Reverse shoulder arthroplasty;  Surgeon: Justice Britain, MD;  Location: [place];  Service: Orthopedics;   Laterality: Left;  126min Please move 7:30 case to 10:30am  . SHOULDER SURGERY  02/2017  . TONSILLECTOMY     removed at age 96-23    Social History:  reports that she has never smoked. She has never used smokeless tobacco. She reports that she does not drink alcohol and does not use drugs.  Allergies: No Known Allergies  (Not in a hospital admission)   Blood pressure (!) 180/57, pulse 72, temperature 97.8 F (36.6 C), temperature source Oral, resp. rate 14, SpO2 99 %. Physical Exam: General: pleasant, elderly female who is laying in bed in NAD HEENT: head is normocephalic.  Sclera are noninjected.  PERRL. EOMs intact.  Ears and nose without any masses or lesions.  Mouth is pink and moist. Dentition fair. Superficial abrasion/hematoma noted to right forehead  Heart: regular, rate, and rhythm.  Normal s1,s2. No obvious murmurs, gallops, or rubs noted.  Palpable pedal pulses bilaterally  Lungs: CTAB, no wheezes, rhonchi, or rales noted.  Respiratory effort nonlabored on room air Abd: soft, NT/ND, +BS, no masses, hernias, or organomegaly MS: no BUE/BLE edema, calves soft and nontender. No gross deformity BUE/BLE Skin: warm and dry with no masses, lesions, or rashes Psych: A&Ox4 with an appropriate affect Neuro: cranial nerves grossly intact, equal strength in BUE/BLE bilaterally, normal speech, thought process intact  Results for orders placed or performed during the hospital encounter of 11/27/20 (from the past 48 hour(s))  Comprehensive metabolic panel     Status: Abnormal   Collection Time: 11/27/20  2:25 PM  Result Value  Ref Range   Sodium 139 135 - 145 mmol/L   Potassium 4.7 3.5 - 5.1 mmol/L   Chloride 103 98 - 111 mmol/L   CO2 21 (L) 22 - 32 mmol/L   Glucose, Bld 116 (H) 70 - 99 mg/dL    Comment: Glucose reference range applies only to samples taken after fasting for at least 8 hours.   BUN 28 (H) 8 - 23 mg/dL   Creatinine, Ser 1.21 (H) 0.44 - 1.00 mg/dL   Calcium 10.5 (H)  8.9 - 10.3 mg/dL   Total Protein 6.6 6.5 - 8.1 g/dL   Albumin 3.7 3.5 - 5.0 g/dL   AST 21 15 - 41 U/L   ALT 14 0 - 44 U/L   Alkaline Phosphatase 52 38 - 126 U/L   Total Bilirubin 1.0 0.3 - 1.2 mg/dL   GFR, Estimated 42 (L) >60 mL/min    Comment: (NOTE) Calculated using the CKD-EPI Creatinine Equation (2021)    Anion gap 15 5 - 15    Comment: Performed at [facility] [address] [place], [place] [address]  Troponin I (High Sensitivity)     Status: None   Collection Time: 11/27/20  2:25 PM  Result Value Ref Range   Troponin I (High Sensitivity) 17 <18 ng/L    Comment: (NOTE) Elevated high sensitivity troponin I (hsTnI) values and significant  changes across serial measurements may suggest ACS but many other  chronic and acute conditions are known to elevate hsTnI results.  Refer to the "Links" section for chest pain algorithms and additional  guidance. Performed at [facility], [place] [address] [place], [place] [address]   CBC with Differential     Status: None   Collection Time: 11/27/20  2:25 PM  Result Value Ref Range   WBC 9.8 4.0 - 10.5 K/uL   RBC 3.92 3.87 - 5.11 MIL/uL   Hemoglobin 12.5 12.0 - 15.0 g/dL   HCT 39.0 36.0 - 46.0 %   MCV 99.5 80.0 - 100.0 fL   MCH 31.9 26.0 - 34.0 pg   MCHC 32.1 30.0 - 36.0 g/dL   RDW 11.8 11.5 - 15.5 %   Platelets 201 150 - 400 K/uL   nRBC 0.0 0.0 - 0.2 %   Neutrophils Relative % 78 %   Neutro Abs 7.7 1.7 - 7.7 K/uL   Lymphocytes Relative 13 %   Lymphs Abs 1.3 0.7 - 4.0 K/uL   Monocytes Relative 6 %   Monocytes Absolute 0.6 0.1 - 1.0 K/uL   Eosinophils Relative 1 %   Eosinophils Absolute 0.1 0.0 - 0.5 K/uL   Basophils Relative 1 %   Basophils Absolute 0.1 0.0 - 0.1 K/uL   Immature Granulocytes 1 %   Abs Immature Granulocytes 0.05 0.00 - 0.07 K/uL    Comment: Performed at [facility], [address], [place], [place] [address]   DG Chest 2 View  Result Date: 11/27/2020 CLINICAL DATA:  Posterior right  chest and rib pain after fall EXAM: CHEST - 2 VIEW COMPARISON:  08/27/2020 FINDINGS: Stable cardiomediastinal contours. Atherosclerotic calcification of the aortic knob. Subtle 5 mm somewhat nodular density within the periphery of the right mid lung, which appears new from prior. The lungs are otherwise clear. No pleural effusion or pneumothorax. Reverse left shoulder arthroplasty. Possible nondisplaced fractures of the anterolateral aspects of the right fourth and fifth ribs. IMPRESSION: 1. Subtle 5 mm nodular density within the periphery of the right mid  lung, new from prior. Findings may reflect a focus of inflection or inflammation versus a pulmonary nodule. Recommend short-term follow-up imaging to ensure resolution. 2. Possible nondisplaced fractures of the anterolateral aspects of the right fourth and fifth ribs. Correlate with point tenderness. 3. No pneumothorax. Electronically Signed   By: Davina Poke D.O.   On: 11/27/2020 14:22   CT Head Wo Contrast  Result Date: 11/27/2020 CLINICAL DATA:  Facial trauma. Neck trauma. Syncopal episode, hematoma over right eye. EXAM: CT HEAD WITHOUT CONTRAST CT MAXILLOFACIAL WITHOUT CONTRAST CT CERVICAL SPINE WITHOUT CONTRAST TECHNIQUE: Multidetector CT imaging of the head, cervical spine, and maxillofacial structures were performed using the standard protocol without intravenous contrast. Multiplanar CT image reconstructions of the cervical spine and maxillofacial structures were also generated. COMPARISON:  CT head/cervical spine/maxillofacial 08/27/2020. FINDINGS: CT HEAD FINDINGS Brain: Mild cerebral and cerebellar atrophy. Acute subdural hematoma overlying the anterior right frontal lobe/frontal operculum and right temporal lobe measuring up to 6 mm in thickness (for instance as seen on series 5, image 21). Minimal mass effect upon the underlying right cerebral hemisphere. Trace 1-2 mm leftward midline shift at the level of the septum pellucidum. Additional  trace acute subdural hemorrhage along the right aspect of the falx anteriorly. Ill-defined hypoattenuation within the cerebral white matter is nonspecific, but compatible with chronic small vessel ischemic disease. Chronic small vessel infarcts within the deep frontal white matter bilaterally. No demarcated cortical infarct. No evidence of intracranial mass. Vascular: No hyperdense vessel.  Atherosclerotic calcifications. Skull: Normal. Negative for fracture or focal lesion. Other: Sizable right anterior scalp hematoma extending to the right periorbital region. CT MAXILLOFACIAL FINDINGS Osseous: No acute maxillofacial fracture is identified. Redemonstrated chronic fracture deformity of the maxillary alveolar ridge on the right. Orbits: Right periorbital hematoma. No other acute finding. The globes are normal in size and contour. The extraocular muscles and optic nerve sheath complexes are symmetric and unremarkable. Sinuses: Trace bilateral ethmoid and maxillary sinus mucosal thickening. Soft tissues: Sizable hematoma within the right anterior scalp and extending to the right periorbital region. CT CERVICAL SPINE FINDINGS Alignment: Trace C4-C5 grade 1 retrolisthesis. Skull base and vertebrae: The basion-dental and atlanto-dental intervals are maintained.No evidence of acute fracture to the cervical spine. Soft tissues and spinal canal: No prevertebral fluid or swelling. No visible canal hematoma. Disc levels: Cervical spondylosis with multilevel disc space narrowing, disc bulges and uncovertebral hypertrophy. Disc space narrowing is severe at C3-C4. Upper chest: No consolidation within the imaged lung apices. No visible pneumothorax. These results were called by telephone at the time of interpretation on 11/27/2020 at 3:38 pm to provider Dr. Alvino Chapel, who verbally acknowledged these results. IMPRESSION: CT head: 1. Acute subdural hematoma overlying the anterior right frontal lobe/frontal operculum and right  temporal lobe, measuring 6 mm in thickness. Minimal mass effect upon the underlying right cerebral hemisphere. Trace 1-2 mm leftward midline shift. 2. Trace acute subdural hemorrhage along the right aspect of the anterior falx. 3. Sizable right anterior scalp hematoma extending to the right periorbital region. 4. Generalized atrophy of the brain and chronic small vessel ischemic disease. CT maxillofacial: 1. No evidence of acute maxillofacial fracture. 2. Sizable right anterior scalp hematoma extending to the right periorbital region. CT cervical spine: 1. No evidence of acute fracture to the cervical spine. 2. Unchanged mild C4-C5 grade 1 retrolisthesis. 3. Cervical spondylosis as described. Electronically Signed   By: Kellie Simmering DO   On: 11/27/2020 15:40   CT Cervical Spine Wo Contrast  Result Date:  11/27/2020 CLINICAL DATA:  Facial trauma. Neck trauma. Syncopal episode, hematoma over right eye. EXAM: CT HEAD WITHOUT CONTRAST CT MAXILLOFACIAL WITHOUT CONTRAST CT CERVICAL SPINE WITHOUT CONTRAST TECHNIQUE: Multidetector CT imaging of the head, cervical spine, and maxillofacial structures were performed using the standard protocol without intravenous contrast. Multiplanar CT image reconstructions of the cervical spine and maxillofacial structures were also generated. COMPARISON:  CT head/cervical spine/maxillofacial 08/27/2020. FINDINGS: CT HEAD FINDINGS Brain: Mild cerebral and cerebellar atrophy. Acute subdural hematoma overlying the anterior right frontal lobe/frontal operculum and right temporal lobe measuring up to 6 mm in thickness (for instance as seen on series 5, image 21). Minimal mass effect upon the underlying right cerebral hemisphere. Trace 1-2 mm leftward midline shift at the level of the septum pellucidum. Additional trace acute subdural hemorrhage along the right aspect of the falx anteriorly. Ill-defined hypoattenuation within the cerebral white matter is nonspecific, but compatible with  chronic small vessel ischemic disease. Chronic small vessel infarcts within the deep frontal white matter bilaterally. No demarcated cortical infarct. No evidence of intracranial mass. Vascular: No hyperdense vessel.  Atherosclerotic calcifications. Skull: Normal. Negative for fracture or focal lesion. Other: Sizable right anterior scalp hematoma extending to the right periorbital region. CT MAXILLOFACIAL FINDINGS Osseous: No acute maxillofacial fracture is identified. Redemonstrated chronic fracture deformity of the maxillary alveolar ridge on the right. Orbits: Right periorbital hematoma. No other acute finding. The globes are normal in size and contour. The extraocular muscles and optic nerve sheath complexes are symmetric and unremarkable. Sinuses: Trace bilateral ethmoid and maxillary sinus mucosal thickening. Soft tissues: Sizable hematoma within the right anterior scalp and extending to the right periorbital region. CT CERVICAL SPINE FINDINGS Alignment: Trace C4-C5 grade 1 retrolisthesis. Skull base and vertebrae: The basion-dental and atlanto-dental intervals are maintained.No evidence of acute fracture to the cervical spine. Soft tissues and spinal canal: No prevertebral fluid or swelling. No visible canal hematoma. Disc levels: Cervical spondylosis with multilevel disc space narrowing, disc bulges and uncovertebral hypertrophy. Disc space narrowing is severe at C3-C4. Upper chest: No consolidation within the imaged lung apices. No visible pneumothorax. These results were called by telephone at the time of interpretation on 11/27/2020 at 3:38 pm to provider Dr. Alvino Chapel, who verbally acknowledged these results. IMPRESSION: CT head: 1. Acute subdural hematoma overlying the anterior right frontal lobe/frontal operculum and right temporal lobe, measuring 6 mm in thickness. Minimal mass effect upon the underlying right cerebral hemisphere. Trace 1-2 mm leftward midline shift. 2. Trace acute subdural hemorrhage  along the right aspect of the anterior falx. 3. Sizable right anterior scalp hematoma extending to the right periorbital region. 4. Generalized atrophy of the brain and chronic small vessel ischemic disease. CT maxillofacial: 1. No evidence of acute maxillofacial fracture. 2. Sizable right anterior scalp hematoma extending to the right periorbital region. CT cervical spine: 1. No evidence of acute fracture to the cervical spine. 2. Unchanged mild C4-C5 grade 1 retrolisthesis. 3. Cervical spondylosis as described. Electronically Signed   By: Kellie Simmering DO   On: 11/27/2020 15:40   CT Maxillofacial WO CM  Result Date: 11/27/2020 CLINICAL DATA:  Facial trauma. Neck trauma. Syncopal episode, hematoma over right eye. EXAM: CT HEAD WITHOUT CONTRAST CT MAXILLOFACIAL WITHOUT CONTRAST CT CERVICAL SPINE WITHOUT CONTRAST TECHNIQUE: Multidetector CT imaging of the head, cervical spine, and maxillofacial structures were performed using the standard protocol without intravenous contrast. Multiplanar CT image reconstructions of the cervical spine and maxillofacial structures were also generated. COMPARISON:  CT head/cervical spine/maxillofacial 08/27/2020. FINDINGS:  CT HEAD FINDINGS Brain: Mild cerebral and cerebellar atrophy. Acute subdural hematoma overlying the anterior right frontal lobe/frontal operculum and right temporal lobe measuring up to 6 mm in thickness (for instance as seen on series 5, image 21). Minimal mass effect upon the underlying right cerebral hemisphere. Trace 1-2 mm leftward midline shift at the level of the septum pellucidum. Additional trace acute subdural hemorrhage along the right aspect of the falx anteriorly. Ill-defined hypoattenuation within the cerebral white matter is nonspecific, but compatible with chronic small vessel ischemic disease. Chronic small vessel infarcts within the deep frontal white matter bilaterally. No demarcated cortical infarct. No evidence of intracranial mass. Vascular:  No hyperdense vessel.  Atherosclerotic calcifications. Skull: Normal. Negative for fracture or focal lesion. Other: Sizable right anterior scalp hematoma extending to the right periorbital region. CT MAXILLOFACIAL FINDINGS Osseous: No acute maxillofacial fracture is identified. Redemonstrated chronic fracture deformity of the maxillary alveolar ridge on the right. Orbits: Right periorbital hematoma. No other acute finding. The globes are normal in size and contour. The extraocular muscles and optic nerve sheath complexes are symmetric and unremarkable. Sinuses: Trace bilateral ethmoid and maxillary sinus mucosal thickening. Soft tissues: Sizable hematoma within the right anterior scalp and extending to the right periorbital region. CT CERVICAL SPINE FINDINGS Alignment: Trace C4-C5 grade 1 retrolisthesis. Skull base and vertebrae: The basion-dental and atlanto-dental intervals are maintained.No evidence of acute fracture to the cervical spine. Soft tissues and spinal canal: No prevertebral fluid or swelling. No visible canal hematoma. Disc levels: Cervical spondylosis with multilevel disc space narrowing, disc bulges and uncovertebral hypertrophy. Disc space narrowing is severe at C3-C4. Upper chest: No consolidation within the imaged lung apices. No visible pneumothorax. These results were called by telephone at the time of interpretation on 11/27/2020 at 3:38 pm to provider Dr. Alvino Chapel, who verbally acknowledged these results. IMPRESSION: CT head: 1. Acute subdural hematoma overlying the anterior right frontal lobe/frontal operculum and right temporal lobe, measuring 6 mm in thickness. Minimal mass effect upon the underlying right cerebral hemisphere. Trace 1-2 mm leftward midline shift. 2. Trace acute subdural hemorrhage along the right aspect of the anterior falx. 3. Sizable right anterior scalp hematoma extending to the right periorbital region. 4. Generalized atrophy of the brain and chronic small vessel  ischemic disease. CT maxillofacial: 1. No evidence of acute maxillofacial fracture. 2. Sizable right anterior scalp hematoma extending to the right periorbital region. CT cervical spine: 1. No evidence of acute fracture to the cervical spine. 2. Unchanged mild C4-C5 grade 1 retrolisthesis. 3. Cervical spondylosis as described. Electronically Signed   By: Kellie Simmering DO   On: 11/27/2020 15:40   Assessment/Plan HTN PMH dysrhythmia  Hx left reverse total shoulder replacement Pulmonary nodules - noted on CT, radiology rec repeat CT 3-6 months  Syncopal episode vs mechanical fall, amnestic to the event  Right frontal SDH - NS consult pending, TBI therapies  Right scalp hematoma - local wound care, bacitracin R anterior Rib FX 4-5 -  no PTX appreciated, multimodal pain control, IS/pulm toilet, CXR in AM   FEN: per primary team/NS, ok for diet from trauma standpoint ID: no abx indicated at this time VTE: SCD's, chemical VTE held in the setting of acute intracranial bleed  Dispo: Admit to medical service for syncopal workup. NS consult pending. Pain control, CXR in AM. Recommend TBI team therapies.  Wellington Hampshire, PA-C [facility] Surgery Please see Amion for pager number during day hours 7:00am-4:30pm 11/27/2020, 4:22 PM

## 2020-11-27 NOTE — ED Triage Notes (Addendum)
Pt arrived from [facility] [place] [facility] via [facility] after facility staff found pt in elevator after an apparent fall. Pt has no memory of fall. Swelling and bleeding not to right eyebrow. Bleeding nearly stopped at time of arrival to ED. No c-collar noted. EMS reported pt was incontinent on scene, no hx of such reported by staff. Pt was alert and oriented at time of triage. EMS VSS. Not currently taking blood thinner, per EMS.

## 2020-11-27 NOTE — ED Notes (Signed)
Urine culture tube sent down with urine sample.   

## 2020-11-27 NOTE — H&P (Signed)
History and Physical  Patient Name: Valerie Molina     [id number]    DOB: 18-Jan-1926    DOA: 11/27/2020 PCP: Haywood Pao, MD  Patient coming from: SNF  Chief Complaint: Ground-level fall; forehead laceration    HPI: MARCINA Molina is a 86 y.o. female with hx of hypertension, glaucoma who presented to the hospital on 11/27/2020 secondary to ground-level fall and forehead laceration.  Patient states earlier today she had routine dental work, cleaning done and she went back to her facility and she believes she passed out and elevator and hit her head.  She does remember the incident but remembers waking up with paramedics around her.  She had a laceration on her right forehead.  EMS was called.  Her blood pressure been stable.  She is not on any blood thinners at home.  She denies any similar symptoms in the past.  She did fall several months ago, but she did remember this fall, says she tripped over her shoe, and did break her humerus and required surgery.  Her son who is bedside believes this was a mechanical fall, not a syncopal event, but he was not there.  ED course: -Initial presentation, afebrile, regular rate, slightly hypertensive. -Relevant labs on initial presentation: Sodium 139, potassium 4.7, chloride 103, bicarb 21, glucose 116, creatinine 1.21, BUN 28, WBC 9.8, hemoglobin 12.5, platelets 201 -CT of the head demonstrated acute subdural hematoma in the right frontal lobe measuring 6 mm in thickness. -Chest x-ray demonstrates fourth and fifth rib fractures -Neurosurgery contacted in the ED, recommend being brought in for observation with repeat CT head in the morning -Trauma surgery contacted in ED, recommended being brought in for observation, pain control, monitoring -Patient denies any shortness of breath, abdominal pain, nausea, vomiting, dyspnea at the time of my exam. -Forehead laceration was sutured by ED provider -The hospitalist service was contacted for  further evaluation, management, and bringing the patient in for observation     ROS: ROS        Past Medical History:  Diagnosis Date  . Dysrhythmia   . Hypertension   . PONV (postoperative nausea and vomiting)     Past Surgical History:  Procedure Laterality Date  . ABDOMINAL HYSTERECTOMY     14-15 years ago  . EYE SURGERY     "1 eye about 15 years ago; other eye about 10 years ago"  . REVERSE SHOULDER ARTHROPLASTY Left 09/29/2018  . REVERSE SHOULDER ARTHROPLASTY Left 09/29/2018   Procedure: left shoulder hardware removal, conversion to Reverse shoulder arthroplasty;  Surgeon: Justice Britain, MD;  Location: [place];  Service: Orthopedics;  Laterality: Left;  183min Please move 7:30 case to 10:30am  . SHOULDER SURGERY  02/2017  . TONSILLECTOMY     removed at age 81-23    Social History: Patient lives at [facility]  The patient walks without assistance.  Non-smoker.  No Known Allergies  Family history: *family history is not on file.  Prior to Admission medications   Medication Sig Start Date End Date Taking? Authorizing Provider  acetaminophen (TYLENOL) 325 MG tablet Take 1-2 tablets (325-650 mg total) by mouth every 6 (six) hours as needed for mild pain (pain score 1-3 or temp > 100.5). 09/30/18   Shuford, Tracy, PA-C  ALPHAGAN P 0.1 % SOLN Apply 1 drop to eye 2 (two) times daily. 06/27/20   [provider]  amLODipine (NORVASC) 5 MG tablet Take 5 mg by mouth daily.    [provider]  amoxicillin (AMOXIL) 500 MG capsule Take 1,000 mg by mouth 2 (two) times daily. FOR 5 DAYS 11/19/20   [provider]  dorzolamide-timolol (COSOPT) 22.3-6.8 MG/ML ophthalmic solution Place 1 drop into both eyes 2 (two) times daily.    [provider]  latanoprost (XALATAN) 0.005 % ophthalmic solution Place 1 drop into both eyes at bedtime.    [provider]  levothyroxine (SYNTHROID, LEVOTHROID) 50 MCG tablet Take 50 mcg by mouth daily.     [provider]  losartan (COZAAR) 50 MG tablet Take 50 mg by mouth daily.    [provider]  Multiple Vitamins-Minerals (MULTIVITAMIN WITH MINERALS) tablet Take 1 tablet by mouth daily.    [provider]  spironolactone (ALDACTONE) 25 MG tablet Take 25 mg by mouth daily.    [provider]  traMADol (ULTRAM) 50 MG tablet Take 1 tablet (50 mg total) by mouth every 6 (six) hours as needed (prn mild mod pain, pain score 4-6). 09/30/18   Shuford, French Ana, PA-C       Physical Exam: BP (!) 144/57   Pulse 80   Temp 97.8 F (36.6 C) (Oral)   Resp 16   SpO2 97%  General appearance: Well-developed, adult female, alert and in no distress.   Eyes: Anicteric, conjunctiva pink, lids and lashes normal. PERRL.    ENT: No nasal deformity, discharge, epistaxis.  Right forehead laceration.   Neck: No neck masses.  Trachea midline.   Lymph: No cervical or supraclavicular lymphadenopathy. Skin: Warm and dry.  No jaundice.  No suspicious rashes or lesions. Cardiac: RRR, nl S1-S2, no murmurs appreciated.  Capillary refill is brisk. No LE edema.  Radial and pedal pulses 2+ and symmetric. Respiratory: Normal respiratory rate and rhythm.  CTAB without rales or wheezes. Abdomen: Abdomen soft.  Bowel sounds present. No ascites, distension, hepatosplenomegaly.   MSK: No deformities or effusions of the large joints of the upper or lower extremities bilaterally.  No cyanosis or clubbing Neuro: Cranial nerves 2 through 12 grossly intact.  Sensation intact to light touch. Speech is fluent.  Muscle strength 5/5 upper and lower extremities bilaterally.    Psych: Sensorium intact and responding to questions, attention normal.  Behavior appropriate.  Affect normal.  Judgment and insight appear normal.     Labs on Admission:  I have personally reviewed following labs and imaging studies:    CBC: Recent Labs  Lab 11/27/20 1425  WBC 9.8  NEUTROABS 7.7  HGB 12.5  HCT 39.0  MCV 99.5   PLT 201   Basic Metabolic Panel: Recent Labs  Lab 11/27/20 1425  NA 139  K 4.7  CL 103  CO2 21*  GLUCOSE 116*  BUN 28*  CREATININE 1.21*  CALCIUM 10.5*   GFR: CrCl cannot be calculated (Unknown ideal weight.).  Liver Function Tests: Recent Labs  Lab 11/27/20 1425  AST 21  ALT 14  ALKPHOS 52  BILITOT 1.0  PROT 6.6  ALBUMIN 3.7   No results for input(s): LIPASE, AMYLASE in the last 168 hours. No results for input(s): AMMONIA in the last 168 hours. Coagulation Profile: Recent Labs  Lab 11/27/20 1653  INR 1.0   Cardiac Enzymes: No results for input(s): CKTOTAL, CKMB, CKMBINDEX, TROPONINI in the last 168 hours. BNP (last 3 results) No results for input(s): PROBNP in the last 8760 hours. HbA1C: No results for input(s): HGBA1C in the last 72 hours. CBG: No results for input(s): GLUCAP in the last 168 hours. Lipid Profile: No  results for input(s): CHOL, HDL, LDLCALC, TRIG, CHOLHDL, LDLDIRECT in the last 72 hours. Thyroid Function Tests: No results for input(s): TSH, T4TOTAL, FREET4, T3FREE, THYROIDAB in the last 72 hours. Anemia Panel: No results for input(s): VITAMINB12, FOLATE, FERRITIN, TIBC, IRON, RETICCTPCT in the last 72 hours.   Recent Results (from the past 240 hour(s))  Resp Panel by RT-PCR (Flu A&B, Covid) Nasopharyngeal Swab     Status: None   Collection Time: 11/27/20  4:53 PM   Specimen: Nasopharyngeal Swab; Nasopharyngeal(NP) swabs in vial transport medium  Result Value Ref Range Status   SARS Coronavirus 2 by RT PCR NEGATIVE NEGATIVE Final    Comment: (NOTE) SARS-CoV-2 target nucleic acids are NOT DETECTED.  The SARS-CoV-2 RNA is generally detectable in upper respiratory specimens during the acute phase of infection. The lowest concentration of SARS-CoV-2 viral copies this assay can detect is 138 copies/mL. A negative result does not preclude SARS-Cov-2 infection and should not be used as the sole basis for treatment or other patient  management decisions. A negative result may occur with  improper specimen collection/handling, submission of specimen other than nasopharyngeal swab, presence of viral mutation(s) within the areas targeted by this assay, and inadequate number of viral copies(<138 copies/mL). A negative result must be combined with clinical observations, patient history, and epidemiological information. The expected result is Negative.  Fact Sheet for Patients:  EntrepreneurPulse.com.au  Fact Sheet for Healthcare Providers:  IncredibleEmployment.be  This test is no t yet approved or cleared by the [place] FDA and  has been authorized for detection and/or diagnosis of SARS-CoV-2 by FDA under an Emergency Use Authorization (EUA). This EUA will remain  in effect (meaning this test can be used) for the duration of the COVID-19 declaration under Section 564(b)(1) of the Act, 21 U.S.C.section 360bbb-3(b)(1), unless the authorization is terminated  or revoked sooner.       Influenza A by PCR NEGATIVE NEGATIVE Final   Influenza B by PCR NEGATIVE NEGATIVE Final    Comment: (NOTE) The Xpert Xpress SARS-CoV-2/FLU/RSV plus assay is intended as an aid in the diagnosis of influenza from Nasopharyngeal swab specimens and should not be used as a sole basis for treatment. Nasal washings and aspirates are unacceptable for Xpert Xpress SARS-CoV-2/FLU/RSV testing.  Fact Sheet for Patients: EntrepreneurPulse.com.au  Fact Sheet for Healthcare Providers: IncredibleEmployment.be  This test is not yet approved or cleared by the [place] FDA and has been authorized for detection and/or diagnosis of SARS-CoV-2 by FDA under an Emergency Use Authorization (EUA). This EUA will remain in effect (meaning this test can be used) for the duration of the COVID-19 declaration under Section 564(b)(1) of the Act, 21 U.S.C. section 360bbb-3(b)(1),  unless the authorization is terminated or revoked.  Performed at [facility], [place] [address] [place] [address]            Radiological Exams on Admission: Personally reviewed which shows right fourth and fifth rib fracture, acute subdural hematoma overlying the right anterior frontal lobe  DG Chest 2 View  Result Date: 11/27/2020 CLINICAL DATA:  Posterior right chest and rib pain after fall EXAM: CHEST - 2 VIEW COMPARISON:  08/27/2020 FINDINGS: Stable cardiomediastinal contours. Atherosclerotic calcification of the aortic knob. Subtle 5 mm somewhat nodular density within the periphery of the right mid lung, which appears new from prior. The lungs are otherwise clear. No pleural effusion or pneumothorax. Reverse left shoulder arthroplasty. Possible nondisplaced fractures of the anterolateral aspects of the right fourth and  fifth ribs. IMPRESSION: 1. Subtle 5 mm nodular density within the periphery of the right mid lung, new from prior. Findings may reflect a focus of inflection or inflammation versus a pulmonary nodule. Recommend short-term follow-up imaging to ensure resolution. 2. Possible nondisplaced fractures of the anterolateral aspects of the right fourth and fifth ribs. Correlate with point tenderness. 3. No pneumothorax. Electronically Signed   By: Davina Poke D.O.   On: 11/27/2020 14:22   DG Pelvis 1-2 Views  Result Date: 11/27/2020 CLINICAL DATA:  Fall.  Denies hip or pelvic pain. EXAM: PELVIS - 1-2 VIEW COMPARISON:  Pelvis x-ray dated August 27, 2020. FINDINGS: There is no evidence of pelvic fracture or diastasis. No pelvic bone lesions are seen. IMPRESSION: Negative. Electronically Signed   By: Titus Dubin M.D.   On: 11/27/2020 16:44   CT Head Wo Contrast  Result Date: 11/27/2020 CLINICAL DATA:  Facial trauma. Neck trauma. Syncopal episode, hematoma over right eye. EXAM: CT HEAD WITHOUT CONTRAST CT MAXILLOFACIAL WITHOUT CONTRAST CT CERVICAL SPINE  WITHOUT CONTRAST TECHNIQUE: Multidetector CT imaging of the head, cervical spine, and maxillofacial structures were performed using the standard protocol without intravenous contrast. Multiplanar CT image reconstructions of the cervical spine and maxillofacial structures were also generated. COMPARISON:  CT head/cervical spine/maxillofacial 08/27/2020. FINDINGS: CT HEAD FINDINGS Brain: Mild cerebral and cerebellar atrophy. Acute subdural hematoma overlying the anterior right frontal lobe/frontal operculum and right temporal lobe measuring up to 6 mm in thickness (for instance as seen on series 5, image 21). Minimal mass effect upon the underlying right cerebral hemisphere. Trace 1-2 mm leftward midline shift at the level of the septum pellucidum. Additional trace acute subdural hemorrhage along the right aspect of the falx anteriorly. Ill-defined hypoattenuation within the cerebral white matter is nonspecific, but compatible with chronic small vessel ischemic disease. Chronic small vessel infarcts within the deep frontal white matter bilaterally. No demarcated cortical infarct. No evidence of intracranial mass. Vascular: No hyperdense vessel.  Atherosclerotic calcifications. Skull: Normal. Negative for fracture or focal lesion. Other: Sizable right anterior scalp hematoma extending to the right periorbital region. CT MAXILLOFACIAL FINDINGS Osseous: No acute maxillofacial fracture is identified. Redemonstrated chronic fracture deformity of the maxillary alveolar ridge on the right. Orbits: Right periorbital hematoma. No other acute finding. The globes are normal in size and contour. The extraocular muscles and optic nerve sheath complexes are symmetric and unremarkable. Sinuses: Trace bilateral ethmoid and maxillary sinus mucosal thickening. Soft tissues: Sizable hematoma within the right anterior scalp and extending to the right periorbital region. CT CERVICAL SPINE FINDINGS Alignment: Trace C4-C5 grade 1  retrolisthesis. Skull base and vertebrae: The basion-dental and atlanto-dental intervals are maintained.No evidence of acute fracture to the cervical spine. Soft tissues and spinal canal: No prevertebral fluid or swelling. No visible canal hematoma. Disc levels: Cervical spondylosis with multilevel disc space narrowing, disc bulges and uncovertebral hypertrophy. Disc space narrowing is severe at C3-C4. Upper chest: No consolidation within the imaged lung apices. No visible pneumothorax. These results were called by telephone at the time of interpretation on 11/27/2020 at 3:38 pm to provider Dr. Alvino Chapel, who verbally acknowledged these results. IMPRESSION: CT head: 1. Acute subdural hematoma overlying the anterior right frontal lobe/frontal operculum and right temporal lobe, measuring 6 mm in thickness. Minimal mass effect upon the underlying right cerebral hemisphere. Trace 1-2 mm leftward midline shift. 2. Trace acute subdural hemorrhage along the right aspect of the anterior falx. 3. Sizable right anterior scalp hematoma extending to the right periorbital region. 4.  Generalized atrophy of the brain and chronic small vessel ischemic disease. CT maxillofacial: 1. No evidence of acute maxillofacial fracture. 2. Sizable right anterior scalp hematoma extending to the right periorbital region. CT cervical spine: 1. No evidence of acute fracture to the cervical spine. 2. Unchanged mild C4-C5 grade 1 retrolisthesis. 3. Cervical spondylosis as described. Electronically Signed   By: Kellie Simmering DO   On: 11/27/2020 15:40   CT Chest W Contrast  Result Date: 11/27/2020 CLINICAL DATA:  Found down, fell, posterior right chest pain EXAM: CT CHEST WITH CONTRAST TECHNIQUE: Multidetector CT imaging of the chest was performed during intravenous contrast administration. CONTRAST:  36mL OMNIPAQUE IOHEXOL 300 MG/ML  SOLN COMPARISON:  11/27/2020 FINDINGS: Cardiovascular: The heart and great vessels are unremarkable without  pericardial effusion. Moderate coronary artery atherosclerosis. No evidence of vascular injury. Normal caliber of the thoracic aorta, with diffuse atherosclerosis. Mediastinum/Nodes: No enlarged mediastinal, hilar, or axillary lymph nodes. Thyroid gland, trachea, and esophagus demonstrate no significant findings. Lungs/Pleura: No acute airspace disease, effusion, or pneumothorax. Dependent hypoventilatory changes are seen within the lower lobes. Multiple nodules are seen within the right upper lobe abutting the minor fissure, largest measuring 15 x 7 x 12 mm. No other pulmonary nodules or masses. The central airways are patent. Upper Abdomen: Bilateral renal cortical thinning. Indeterminate renal hypodensities likely reflects cysts. No acute upper abdominal findings. Musculoskeletal: There are nondisplaced right anterior fourth and fifth rib fractures corresponding to chest x-ray findings. No other acute bony abnormalities. Reconstructed images are unremarkable. IMPRESSION: 1. Nondisplaced right anterior fourth and fifth rib fractures. 2. Multiple contiguous right upper lobe pulmonary nodules, largest measuring 11 mm in mean diameter. Non-contrast chest CT at 3-6 months is recommended. If the nodules are stable at time of repeat CT, then future CT at 18-24 months (from today's scan) is considered optional for low-risk patients, but is recommended for high-risk patients. This recommendation follows the consensus statement: Guidelines for Management of Incidental Pulmonary Nodules Detected on CT Images: From the Fleischner Society 2017; Radiology 2017; 284:228-243. 3. Aortic Atherosclerosis (ICD10-I70.0). Coronary artery atherosclerosis. Electronically Signed   By: Randa Ngo M.D.   On: 11/27/2020 16:31   CT Cervical Spine Wo Contrast  Result Date: 11/27/2020 CLINICAL DATA:  Facial trauma. Neck trauma. Syncopal episode, hematoma over right eye. EXAM: CT HEAD WITHOUT CONTRAST CT MAXILLOFACIAL WITHOUT CONTRAST  CT CERVICAL SPINE WITHOUT CONTRAST TECHNIQUE: Multidetector CT imaging of the head, cervical spine, and maxillofacial structures were performed using the standard protocol without intravenous contrast. Multiplanar CT image reconstructions of the cervical spine and maxillofacial structures were also generated. COMPARISON:  CT head/cervical spine/maxillofacial 08/27/2020. FINDINGS: CT HEAD FINDINGS Brain: Mild cerebral and cerebellar atrophy. Acute subdural hematoma overlying the anterior right frontal lobe/frontal operculum and right temporal lobe measuring up to 6 mm in thickness (for instance as seen on series 5, image 21). Minimal mass effect upon the underlying right cerebral hemisphere. Trace 1-2 mm leftward midline shift at the level of the septum pellucidum. Additional trace acute subdural hemorrhage along the right aspect of the falx anteriorly. Ill-defined hypoattenuation within the cerebral white matter is nonspecific, but compatible with chronic small vessel ischemic disease. Chronic small vessel infarcts within the deep frontal white matter bilaterally. No demarcated cortical infarct. No evidence of intracranial mass. Vascular: No hyperdense vessel.  Atherosclerotic calcifications. Skull: Normal. Negative for fracture or focal lesion. Other: Sizable right anterior scalp hematoma extending to the right periorbital region. CT MAXILLOFACIAL FINDINGS Osseous: No acute maxillofacial fracture is identified.  Redemonstrated chronic fracture deformity of the maxillary alveolar ridge on the right. Orbits: Right periorbital hematoma. No other acute finding. The globes are normal in size and contour. The extraocular muscles and optic nerve sheath complexes are symmetric and unremarkable. Sinuses: Trace bilateral ethmoid and maxillary sinus mucosal thickening. Soft tissues: Sizable hematoma within the right anterior scalp and extending to the right periorbital region. CT CERVICAL SPINE FINDINGS Alignment: Trace C4-C5  grade 1 retrolisthesis. Skull base and vertebrae: The basion-dental and atlanto-dental intervals are maintained.No evidence of acute fracture to the cervical spine. Soft tissues and spinal canal: No prevertebral fluid or swelling. No visible canal hematoma. Disc levels: Cervical spondylosis with multilevel disc space narrowing, disc bulges and uncovertebral hypertrophy. Disc space narrowing is severe at C3-C4. Upper chest: No consolidation within the imaged lung apices. No visible pneumothorax. These results were called by telephone at the time of interpretation on 11/27/2020 at 3:38 pm to provider Dr. Alvino Chapel, who verbally acknowledged these results. IMPRESSION: CT head: 1. Acute subdural hematoma overlying the anterior right frontal lobe/frontal operculum and right temporal lobe, measuring 6 mm in thickness. Minimal mass effect upon the underlying right cerebral hemisphere. Trace 1-2 mm leftward midline shift. 2. Trace acute subdural hemorrhage along the right aspect of the anterior falx. 3. Sizable right anterior scalp hematoma extending to the right periorbital region. 4. Generalized atrophy of the brain and chronic small vessel ischemic disease. CT maxillofacial: 1. No evidence of acute maxillofacial fracture. 2. Sizable right anterior scalp hematoma extending to the right periorbital region. CT cervical spine: 1. No evidence of acute fracture to the cervical spine. 2. Unchanged mild C4-C5 grade 1 retrolisthesis. 3. Cervical spondylosis as described. Electronically Signed   By: Kellie Simmering DO   On: 11/27/2020 15:40   CT Maxillofacial WO CM  Result Date: 11/27/2020 CLINICAL DATA:  Facial trauma. Neck trauma. Syncopal episode, hematoma over right eye. EXAM: CT HEAD WITHOUT CONTRAST CT MAXILLOFACIAL WITHOUT CONTRAST CT CERVICAL SPINE WITHOUT CONTRAST TECHNIQUE: Multidetector CT imaging of the head, cervical spine, and maxillofacial structures were performed using the standard protocol without intravenous  contrast. Multiplanar CT image reconstructions of the cervical spine and maxillofacial structures were also generated. COMPARISON:  CT head/cervical spine/maxillofacial 08/27/2020. FINDINGS: CT HEAD FINDINGS Brain: Mild cerebral and cerebellar atrophy. Acute subdural hematoma overlying the anterior right frontal lobe/frontal operculum and right temporal lobe measuring up to 6 mm in thickness (for instance as seen on series 5, image 21). Minimal mass effect upon the underlying right cerebral hemisphere. Trace 1-2 mm leftward midline shift at the level of the septum pellucidum. Additional trace acute subdural hemorrhage along the right aspect of the falx anteriorly. Ill-defined hypoattenuation within the cerebral white matter is nonspecific, but compatible with chronic small vessel ischemic disease. Chronic small vessel infarcts within the deep frontal white matter bilaterally. No demarcated cortical infarct. No evidence of intracranial mass. Vascular: No hyperdense vessel.  Atherosclerotic calcifications. Skull: Normal. Negative for fracture or focal lesion. Other: Sizable right anterior scalp hematoma extending to the right periorbital region. CT MAXILLOFACIAL FINDINGS Osseous: No acute maxillofacial fracture is identified. Redemonstrated chronic fracture deformity of the maxillary alveolar ridge on the right. Orbits: Right periorbital hematoma. No other acute finding. The globes are normal in size and contour. The extraocular muscles and optic nerve sheath complexes are symmetric and unremarkable. Sinuses: Trace bilateral ethmoid and maxillary sinus mucosal thickening. Soft tissues: Sizable hematoma within the right anterior scalp and extending to the right periorbital region. CT CERVICAL SPINE FINDINGS Alignment:  Trace C4-C5 grade 1 retrolisthesis. Skull base and vertebrae: The basion-dental and atlanto-dental intervals are maintained.No evidence of acute fracture to the cervical spine. Soft tissues and spinal  canal: No prevertebral fluid or swelling. No visible canal hematoma. Disc levels: Cervical spondylosis with multilevel disc space narrowing, disc bulges and uncovertebral hypertrophy. Disc space narrowing is severe at C3-C4. Upper chest: No consolidation within the imaged lung apices. No visible pneumothorax. These results were called by telephone at the time of interpretation on 11/27/2020 at 3:38 pm to provider Dr. Alvino Chapel, who verbally acknowledged these results. IMPRESSION: CT head: 1. Acute subdural hematoma overlying the anterior right frontal lobe/frontal operculum and right temporal lobe, measuring 6 mm in thickness. Minimal mass effect upon the underlying right cerebral hemisphere. Trace 1-2 mm leftward midline shift. 2. Trace acute subdural hemorrhage along the right aspect of the anterior falx. 3. Sizable right anterior scalp hematoma extending to the right periorbital region. 4. Generalized atrophy of the brain and chronic small vessel ischemic disease. CT maxillofacial: 1. No evidence of acute maxillofacial fracture. 2. Sizable right anterior scalp hematoma extending to the right periorbital region. CT cervical spine: 1. No evidence of acute fracture to the cervical spine. 2. Unchanged mild C4-C5 grade 1 retrolisthesis. 3. Cervical spondylosis as described. Electronically Signed   By: Kellie Simmering DO   On: 11/27/2020 15:40    EKG: Independently reviewed.  Demonstrates sinus rhythm       Assessment/Plan   1.  Right subdural hematoma -CT of the head demonstrated acute subdural hematoma of the right anterior frontal lobe measuring 6 mm in thickness -Neurosurgery consulted in the ED, recommend follow-up CT of head in the morning -Follow-up CT head ordered for tomorrow morning, 11/28/2020 -No anticoagulation or antiplatelet therapy -PT and OT consulted -Fall precautions   2.  Rib fractures -Imaging demonstrated right fourth and fifth rib fractures -Trauma surgery consulted in the ED,  recommend repeat x-ray in the morning -Repeat imaging of the chest ordered for tomorrow, 11/28/2020 -Pain control as warranted   3. S/P GLF -Status post ground-level fall with injuries as above -PT and OT consulted -Fall precautions -Further plans as above   4.  Essential hypertension -Resume home Norvasc and losartan -Hold home spironolactone to avoid hypovolemia   5.  Glaucoma -Resume home eyedrops    DVT prophylaxis: SCD given subdural hematoma Code Status: Full Family Communication: Discussed with husband bedside Disposition Plan: Anticipate back to SNF Consults called: Trauma surgery and neurosurgery contacted in the ED Admission status: Observation   At the point of initial evaluation, it is my clinical opinion that admission for OBSERVATION is reasonable and necessary because the patient's presenting complaints in the context of their chronic conditions represent sufficient risk of deterioration or significant morbidity to constitute reasonable grounds for close observation in the hospital setting, but that the patient may be medically stable for discharge from the hospital within 24 to 48 hours.    Medical decision making: Patient seen at 7:00 PM on 11/27/2020.  The patient was discussed with Dr. Varney Biles.  What exists of the patient's chart was reviewed in depth and summarized above.  Clinical condition: Fair, needs monitoring.        Doran Heater [facility] Please page though [place] or Epic secure chat:  For password, contact charge nurse

## 2020-11-27 NOTE — ED Notes (Signed)
Patient transported to X-ray 

## 2020-11-27 NOTE — ED Provider Notes (Signed)
[facility] EMERGENCY DEPARTMENT Provider Note   CSN: [id number] Arrival date & time: 11/27/20  1238     History No chief complaint on file.   Valerie Molina is a 86 y.o. female.  HPI   Patient with significant medical history of dysrhythmia, hypertension, presents to the emergency department with  chief complaint of syncopal episode.  Patient endorses that she does not remember but states that she thinks she passed out while she is in the elevator.  She endorses that she remembers coming back from her dental appointment and was walking towards the elevator, she then remembers waking up on the ground with paramedics around her.  Patient is unsure whether she hit her head, loss conscious, but denies being on a blood thinner.  She states she has a slight ache on the right upper eyebrow, she thinks she cut it.  She has no other complaints at this time.  She denies headaches, change in vision, paresthesias or weakness the upper lower extremities.  She has no history of syncope, seizures, and states that she generally takes her blood pressure medications at nighttime.  She states this morning she woke up performed her normal routine, went to the dentist where he was a simple cleaning, she was not provide any  make any medications at this visit.  Patient denies chest pain, shortness of breath, abdominal pain, nausea, vomiting, diarrhea, dysuria, pedal edema.  Spoke with patient's son who is at bedside states this is not like her, she does not have history of passing out, states last time she fell was about 3 months ago but it was a mechanical fall.  He denies any change in her medications.  Spoke with facility, front desk said that a house worker found patient in the elevator and they dialed 911.  They denied seeing her shake, but states she did urinate on herself, and took a minute or 2 to come back to herself.  They inform me that this is a heavily use elevator and does not think  she could have been down for more than 10 minutes.  Past Medical History:  Diagnosis Date   Dysrhythmia    Hypertension    PONV (postoperative nausea and vomiting)     Patient Active Problem List   Diagnosis Date Noted   Subdural hematoma (HCC) 11/27/2020   S/p reverse total shoulder arthroplasty 09/29/2018    Past Surgical History:  Procedure Laterality Date   ABDOMINAL HYSTERECTOMY     14-15 years ago   EYE SURGERY     "1 eye about 15 years ago; other eye about 10 years ago"   REVERSE SHOULDER ARTHROPLASTY Left 09/29/2018   REVERSE SHOULDER ARTHROPLASTY Left 09/29/2018   Procedure: left shoulder hardware removal, conversion to Reverse shoulder arthroplasty;  Surgeon: Francena Hanly, MD;  Location: [facility] OR;  Service: Orthopedics;  Laterality: Left;  Please move 7:30 case to 10:30am   SHOULDER SURGERY  02/2017   TONSILLECTOMY     removed at age 81-23     OB History   No obstetric history on file.     No family history on file.  Social History   Tobacco Use   Smoking status: Never Smoker   Smokeless tobacco: Never Used  Vaping Use   Vaping Use: Never used  Substance Use Topics   Alcohol use: Never   Drug use: Never    Home Medications Prior to Admission medications   Medication Sig Start Date End Date Taking? Authorizing  Provider  acetaminophen (TYLENOL) 325 MG tablet Take 1-2 tablets (325-650 mg total) by mouth every 6 (six) hours as needed for mild pain (pain score 1-3 or temp > 100.5). 09/30/18   Shuford, Tracy, PA-C  ALPHAGAN P 0.1 % SOLN Apply 1 drop to eye 2 (two) times daily. 06/27/20   [provider]  amLODipine (NORVASC) 5 MG tablet Take 5 mg by mouth daily.    [provider]  amoxicillin (AMOXIL) 500 MG capsule Take 1,000 mg by mouth 2 (two) times daily. FOR 5 DAYS 11/19/20   [provider]  dorzolamide-timolol (COSOPT) 22.3-6.8 MG/ML ophthalmic solution Place 1 drop into both eyes 2 (two) times daily.     [provider]  latanoprost (XALATAN) 0.005 % ophthalmic solution Place 1 drop into both eyes at bedtime.    [provider]  levothyroxine (SYNTHROID, LEVOTHROID) 50 MCG tablet Take 50 mcg by mouth daily.     [provider]  losartan (COZAAR) 50 MG tablet Take 50 mg by mouth daily.    [provider]  Multiple Vitamins-Minerals (MULTIVITAMIN WITH MINERALS) tablet Take 1 tablet by mouth daily.    [provider]  spironolactone (ALDACTONE) 25 MG tablet Take 25 mg by mouth daily.    [provider]  traMADol (ULTRAM) 50 MG tablet Take 1 tablet (50 mg total) by mouth every 6 (six) hours as needed (prn mild mod pain, pain score 4-6). 09/30/18   Shuford, Olivia Mackie, PA-C    Allergies    Patient has no known allergies.  Review of Systems   Review of Systems  Constitutional: Negative for chills and fever.  HENT: Negative for congestion and tinnitus.   Eyes: Negative for visual disturbance.  Respiratory: Negative for shortness of breath.   Cardiovascular: Negative for chest pain.  Gastrointestinal: Negative for abdominal pain, diarrhea, nausea and vomiting.  Genitourinary: Negative for enuresis.  Musculoskeletal: Negative for back pain.  Skin: Positive for wound. Negative for rash.       Right eyebrow wound  Neurological: Negative for dizziness and headaches.  Hematological: Does not bruise/bleed easily.    Physical Exam Updated Vital Signs BP (!) 144/57    Pulse 80    Temp 97.8 F (36.6 C) (Oral)    Resp 16    SpO2 97%   Physical Exam Vitals and nursing note reviewed.  Constitutional:      General: She is not in acute distress.    Appearance: She is not ill-appearing.     Comments: Patient's pants were noted to be wet  HENT:     Head: Normocephalic and atraumatic.     Comments: Patient has a noted 3 cm laceration above the right eyebrow, currently hemodynamically stable.  There is also a large hematoma around the laceration  measuring 4 cm in diameter.  no other gross abnormalities noted.    Nose: No congestion.     Mouth/Throat:     Mouth: Mucous membranes are moist.     Pharynx: Oropharynx is clear. No oropharyngeal exudate or posterior oropharyngeal erythema.     Comments: Oropharynx is visualized, tongue and uvula are both midline, she is controlling her own secretions out difficulty.  There is no trauma noted to her teeth or gums, no trismus Eyes:     Extraocular Movements: Extraocular movements intact.     Conjunctiva/sclera: Conjunctivae normal.  Cardiovascular:     Rate and Rhythm: Normal rate and regular rhythm.     Pulses: Normal pulses.  Heart sounds: No murmur heard. No friction rub. No gallop.   Pulmonary:     Effort: No respiratory distress.     Breath sounds: No wheezing, rhonchi or rales.  Abdominal:     Palpations: Abdomen is soft.     Tenderness: There is no abdominal tenderness.  Musculoskeletal:     Cervical back: Normal range of motion. No tenderness.     Comments: Patient is moving all 4 extremities without difficulty.  Patient spine was palpated there is no spinal tenderness, no step-off or crepitus present.  She did have some tenderness along her right posterior ribs along 5 and 6, no deformities noted.  Skin:    General: Skin is warm and dry.  Neurological:     Mental Status: She is alert.     Comments:  Patient is having no difficulty with word finding.   Psychiatric:        Mood and Affect: Mood normal.     ED Results / Procedures / Treatments   Labs (all labs ordered are listed, but only abnormal results are displayed) Labs Reviewed  URINALYSIS, ROUTINE W REFLEX MICROSCOPIC - Abnormal; Notable for the following components:      Result Value   Color, Urine STRAW (*)    Ketones, ur 5 (*)    All other components within normal limits  COMPREHENSIVE METABOLIC PANEL - Abnormal; Notable for the following components:   CO2 21 (*)    Glucose, Bld 116 (*)    BUN 28  (*)    Creatinine, Ser 1.21 (*)    Calcium 10.5 (*)    GFR, Estimated 42 (*)    All other components within normal limits  TROPONIN I (HIGH SENSITIVITY) - Abnormal; Notable for the following components:   Troponin I (High Sensitivity) 26 (*)    All other components within normal limits  RESP PANEL BY RT-PCR (FLU A&B, COVID) ARPGX2  CBC WITH DIFFERENTIAL/PLATELET  PROTIME-INR  COMPREHENSIVE METABOLIC PANEL  CBC  TROPONIN I (HIGH SENSITIVITY)    EKG EKG Interpretation  Date/Time:  Wednesday November 27 2020 13:00:20 EST Ventricular Rate:  71 PR Interval:  174 QRS Duration: 74 QT Interval:  364 QTC Calculation: 395 R Axis:   -2 Text Interpretation: Normal sinus rhythm Cannot rule out Anterior infarct , age undetermined Abnormal ECG Confirmed by Dene Gentry [phone]) on 11/27/2020 5:28:44 PM   Radiology DG Chest 2 View  Result Date: 11/27/2020 CLINICAL DATA:  Posterior right chest and rib pain after fall EXAM: CHEST - 2 VIEW COMPARISON:  08/27/2020 FINDINGS: Stable cardiomediastinal contours. Atherosclerotic calcification of the aortic knob. Subtle 5 mm somewhat nodular density within the periphery of the right mid lung, which appears new from prior. The lungs are otherwise clear. No pleural effusion or pneumothorax. Reverse left shoulder arthroplasty. Possible nondisplaced fractures of the anterolateral aspects of the right fourth and fifth ribs. IMPRESSION: 1. Subtle 5 mm nodular density within the periphery of the right mid lung, new from prior. Findings may reflect a focus of inflection or inflammation versus a pulmonary nodule. Recommend short-term follow-up imaging to ensure resolution. 2. Possible nondisplaced fractures of the anterolateral aspects of the right fourth and fifth ribs. Correlate with point tenderness. 3. No pneumothorax. Electronically Signed   By: Davina Poke D.O.   On: 11/27/2020 14:22   DG Pelvis 1-2 Views  Result Date: 11/27/2020 CLINICAL DATA:  Fall.   Denies hip or pelvic pain. EXAM: PELVIS - 1-2 VIEW COMPARISON:  Pelvis x-ray dated August 27, 2020. FINDINGS: There is no evidence of pelvic fracture or diastasis. No pelvic bone lesions are seen. IMPRESSION: Negative. Electronically Signed   By: Titus Dubin M.D.   On: 11/27/2020 16:44   CT Head Wo Contrast  Result Date: 11/27/2020 CLINICAL DATA:  Facial trauma. Neck trauma. Syncopal episode, hematoma over right eye. EXAM: CT HEAD WITHOUT CONTRAST CT MAXILLOFACIAL WITHOUT CONTRAST CT CERVICAL SPINE WITHOUT CONTRAST TECHNIQUE: Multidetector CT imaging of the head, cervical spine, and maxillofacial structures were performed using the standard protocol without intravenous contrast. Multiplanar CT image reconstructions of the cervical spine and maxillofacial structures were also generated. COMPARISON:  CT head/cervical spine/maxillofacial 08/27/2020. FINDINGS: CT HEAD FINDINGS Brain: Mild cerebral and cerebellar atrophy. Acute subdural hematoma overlying the anterior right frontal lobe/frontal operculum and right temporal lobe measuring up to 6 mm in thickness (for instance as seen on series 5, image 21). Minimal mass effect upon the underlying right cerebral hemisphere. Trace 1-2 mm leftward midline shift at the level of the septum pellucidum. Additional trace acute subdural hemorrhage along the right aspect of the falx anteriorly. Ill-defined hypoattenuation within the cerebral white matter is nonspecific, but compatible with chronic small vessel ischemic disease. Chronic small vessel infarcts within the deep frontal white matter bilaterally. No demarcated cortical infarct. No evidence of intracranial mass. Vascular: No hyperdense vessel.  Atherosclerotic calcifications. Skull: Normal. Negative for fracture or focal lesion. Other: Sizable right anterior scalp hematoma extending to the right periorbital region. CT MAXILLOFACIAL FINDINGS Osseous: No acute maxillofacial fracture is identified. Redemonstrated  chronic fracture deformity of the maxillary alveolar ridge on the right. Orbits: Right periorbital hematoma. No other acute finding. The globes are normal in size and contour. The extraocular muscles and optic nerve sheath complexes are symmetric and unremarkable. Sinuses: Trace bilateral ethmoid and maxillary sinus mucosal thickening. Soft tissues: Sizable hematoma within the right anterior scalp and extending to the right periorbital region. CT CERVICAL SPINE FINDINGS Alignment: Trace C4-C5 grade 1 retrolisthesis. Skull base and vertebrae: The basion-dental and atlanto-dental intervals are maintained.No evidence of acute fracture to the cervical spine. Soft tissues and spinal canal: No prevertebral fluid or swelling. No visible canal hematoma. Disc levels: Cervical spondylosis with multilevel disc space narrowing, disc bulges and uncovertebral hypertrophy. Disc space narrowing is severe at C3-C4. Upper chest: No consolidation within the imaged lung apices. No visible pneumothorax. These results were called by telephone at the time of interpretation on 11/27/2020 at 3:38 pm to provider Dr. Alvino Chapel, who verbally acknowledged these results. IMPRESSION: CT head: 1. Acute subdural hematoma overlying the anterior right frontal lobe/frontal operculum and right temporal lobe, measuring 6 mm in thickness. Minimal mass effect upon the underlying right cerebral hemisphere. Trace 1-2 mm leftward midline shift. 2. Trace acute subdural hemorrhage along the right aspect of the anterior falx. 3. Sizable right anterior scalp hematoma extending to the right periorbital region. 4. Generalized atrophy of the brain and chronic small vessel ischemic disease. CT maxillofacial: 1. No evidence of acute maxillofacial fracture. 2. Sizable right anterior scalp hematoma extending to the right periorbital region. CT cervical spine: 1. No evidence of acute fracture to the cervical spine. 2. Unchanged mild C4-C5 grade 1 retrolisthesis. 3.  Cervical spondylosis as described. Electronically Signed   By: Kellie Simmering DO   On: 11/27/2020 15:40   CT Chest W Contrast  Result Date: 11/27/2020 CLINICAL DATA:  Found down, fell, posterior right chest pain EXAM: CT CHEST WITH CONTRAST TECHNIQUE: Multidetector CT imaging of the chest was performed during intravenous  contrast administration. CONTRAST:  75mL OMNIPAQUE IOHEXOL 300 MG/ML  SOLN COMPARISON:  11/27/2020 FINDINGS: Cardiovascular: The heart and great vessels are unremarkable without pericardial effusion. Moderate coronary artery atherosclerosis. No evidence of vascular injury. Normal caliber of the thoracic aorta, with diffuse atherosclerosis. Mediastinum/Nodes: No enlarged mediastinal, hilar, or axillary lymph nodes. Thyroid gland, trachea, and esophagus demonstrate no significant findings. Lungs/Pleura: No acute airspace disease, effusion, or pneumothorax. Dependent hypoventilatory changes are seen within the lower lobes. Multiple nodules are seen within the right upper lobe abutting the minor fissure, largest measuring 15 x 7 x 12 mm. No other pulmonary nodules or masses. The central airways are patent. Upper Abdomen: Bilateral renal cortical thinning. Indeterminate renal hypodensities likely reflects cysts. No acute upper abdominal findings. Musculoskeletal: There are nondisplaced right anterior fourth and fifth rib fractures corresponding to chest x-ray findings. No other acute bony abnormalities. Reconstructed images are unremarkable. IMPRESSION: 1. Nondisplaced right anterior fourth and fifth rib fractures. 2. Multiple contiguous right upper lobe pulmonary nodules, largest measuring 11 mm in mean diameter. Non-contrast chest CT at 3-6 months is recommended. If the nodules are stable at time of repeat CT, then future CT at 18-24 months (from today's scan) is considered optional for low-risk patients, but is recommended for high-risk patients. This recommendation follows the consensus statement:  Guidelines for Management of Incidental Pulmonary Nodules Detected on CT Images: From the Fleischner Society 2017; Radiology 2017; 284:228-243. 3. Aortic Atherosclerosis (ICD10-I70.0). Coronary artery atherosclerosis. Electronically Signed   By: Sharlet SalinaMichael  Brown M.D.   On: 11/27/2020 16:31   CT Cervical Spine Wo Contrast  Result Date: 11/27/2020 CLINICAL DATA:  Facial trauma. Neck trauma. Syncopal episode, hematoma over right eye. EXAM: CT HEAD WITHOUT CONTRAST CT MAXILLOFACIAL WITHOUT CONTRAST CT CERVICAL SPINE WITHOUT CONTRAST TECHNIQUE: Multidetector CT imaging of the head, cervical spine, and maxillofacial structures were performed using the standard protocol without intravenous contrast. Multiplanar CT image reconstructions of the cervical spine and maxillofacial structures were also generated. COMPARISON:  CT head/cervical spine/maxillofacial 08/27/2020. FINDINGS: CT HEAD FINDINGS Brain: Mild cerebral and cerebellar atrophy. Acute subdural hematoma overlying the anterior right frontal lobe/frontal operculum and right temporal lobe measuring up to 6 mm in thickness (for instance as seen on series 5, image 21). Minimal mass effect upon the underlying right cerebral hemisphere. Trace 1-2 mm leftward midline shift at the level of the septum pellucidum. Additional trace acute subdural hemorrhage along the right aspect of the falx anteriorly. Ill-defined hypoattenuation within the cerebral white matter is nonspecific, but compatible with chronic small vessel ischemic disease. Chronic small vessel infarcts within the deep frontal white matter bilaterally. No demarcated cortical infarct. No evidence of intracranial mass. Vascular: No hyperdense vessel.  Atherosclerotic calcifications. Skull: Normal. Negative for fracture or focal lesion. Other: Sizable right anterior scalp hematoma extending to the right periorbital region. CT MAXILLOFACIAL FINDINGS Osseous: No acute maxillofacial fracture is identified.  Redemonstrated chronic fracture deformity of the maxillary alveolar ridge on the right. Orbits: Right periorbital hematoma. No other acute finding. The globes are normal in size and contour. The extraocular muscles and optic nerve sheath complexes are symmetric and unremarkable. Sinuses: Trace bilateral ethmoid and maxillary sinus mucosal thickening. Soft tissues: Sizable hematoma within the right anterior scalp and extending to the right periorbital region. CT CERVICAL SPINE FINDINGS Alignment: Trace C4-C5 grade 1 retrolisthesis. Skull base and vertebrae: The basion-dental and atlanto-dental intervals are maintained.No evidence of acute fracture to the cervical spine. Soft tissues and spinal canal: No prevertebral fluid or swelling. No visible canal  hematoma. Disc levels: Cervical spondylosis with multilevel disc space narrowing, disc bulges and uncovertebral hypertrophy. Disc space narrowing is severe at C3-C4. Upper chest: No consolidation within the imaged lung apices. No visible pneumothorax. These results were called by telephone at the time of interpretation on 11/27/2020 at 3:38 pm to provider Dr. Alvino Chapel, who verbally acknowledged these results. IMPRESSION: CT head: 1. Acute subdural hematoma overlying the anterior right frontal lobe/frontal operculum and right temporal lobe, measuring 6 mm in thickness. Minimal mass effect upon the underlying right cerebral hemisphere. Trace 1-2 mm leftward midline shift. 2. Trace acute subdural hemorrhage along the right aspect of the anterior falx. 3. Sizable right anterior scalp hematoma extending to the right periorbital region. 4. Generalized atrophy of the brain and chronic small vessel ischemic disease. CT maxillofacial: 1. No evidence of acute maxillofacial fracture. 2. Sizable right anterior scalp hematoma extending to the right periorbital region. CT cervical spine: 1. No evidence of acute fracture to the cervical spine. 2. Unchanged mild C4-C5 grade 1  retrolisthesis. 3. Cervical spondylosis as described. Electronically Signed   By: Kellie Simmering DO   On: 11/27/2020 15:40   CT Maxillofacial WO CM  Result Date: 11/27/2020 CLINICAL DATA:  Facial trauma. Neck trauma. Syncopal episode, hematoma over right eye. EXAM: CT HEAD WITHOUT CONTRAST CT MAXILLOFACIAL WITHOUT CONTRAST CT CERVICAL SPINE WITHOUT CONTRAST TECHNIQUE: Multidetector CT imaging of the head, cervical spine, and maxillofacial structures were performed using the standard protocol without intravenous contrast. Multiplanar CT image reconstructions of the cervical spine and maxillofacial structures were also generated. COMPARISON:  CT head/cervical spine/maxillofacial 08/27/2020. FINDINGS: CT HEAD FINDINGS Brain: Mild cerebral and cerebellar atrophy. Acute subdural hematoma overlying the anterior right frontal lobe/frontal operculum and right temporal lobe measuring up to 6 mm in thickness (for instance as seen on series 5, image 21). Minimal mass effect upon the underlying right cerebral hemisphere. Trace 1-2 mm leftward midline shift at the level of the septum pellucidum. Additional trace acute subdural hemorrhage along the right aspect of the falx anteriorly. Ill-defined hypoattenuation within the cerebral white matter is nonspecific, but compatible with chronic small vessel ischemic disease. Chronic small vessel infarcts within the deep frontal white matter bilaterally. No demarcated cortical infarct. No evidence of intracranial mass. Vascular: No hyperdense vessel.  Atherosclerotic calcifications. Skull: Normal. Negative for fracture or focal lesion. Other: Sizable right anterior scalp hematoma extending to the right periorbital region. CT MAXILLOFACIAL FINDINGS Osseous: No acute maxillofacial fracture is identified. Redemonstrated chronic fracture deformity of the maxillary alveolar ridge on the right. Orbits: Right periorbital hematoma. No other acute finding. The globes are normal in size and  contour. The extraocular muscles and optic nerve sheath complexes are symmetric and unremarkable. Sinuses: Trace bilateral ethmoid and maxillary sinus mucosal thickening. Soft tissues: Sizable hematoma within the right anterior scalp and extending to the right periorbital region. CT CERVICAL SPINE FINDINGS Alignment: Trace C4-C5 grade 1 retrolisthesis. Skull base and vertebrae: The basion-dental and atlanto-dental intervals are maintained.No evidence of acute fracture to the cervical spine. Soft tissues and spinal canal: No prevertebral fluid or swelling. No visible canal hematoma. Disc levels: Cervical spondylosis with multilevel disc space narrowing, disc bulges and uncovertebral hypertrophy. Disc space narrowing is severe at C3-C4. Upper chest: No consolidation within the imaged lung apices. No visible pneumothorax. These results were called by telephone at the time of interpretation on 11/27/2020 at 3:38 pm to provider Dr. Alvino Chapel, who verbally acknowledged these results. IMPRESSION: CT head: 1. Acute subdural hematoma overlying the anterior right  frontal lobe/frontal operculum and right temporal lobe, measuring 6 mm in thickness. Minimal mass effect upon the underlying right cerebral hemisphere. Trace 1-2 mm leftward midline shift. 2. Trace acute subdural hemorrhage along the right aspect of the anterior falx. 3. Sizable right anterior scalp hematoma extending to the right periorbital region. 4. Generalized atrophy of the brain and chronic small vessel ischemic disease. CT maxillofacial: 1. No evidence of acute maxillofacial fracture. 2. Sizable right anterior scalp hematoma extending to the right periorbital region. CT cervical spine: 1. No evidence of acute fracture to the cervical spine. 2. Unchanged mild C4-C5 grade 1 retrolisthesis. 3. Cervical spondylosis as described. Electronically Signed   By: Kellie Simmering DO   On: 11/27/2020 15:40    Procedures .Marland KitchenLaceration Repair  Date/Time: 11/27/2020 5:37  PM Performed by: Marcello Fennel, PA-C Authorized by: Marcello Fennel, PA-C   Consent:    Consent obtained:  Verbal   Consent given by:  Patient   Risks discussed:  Infection, pain, retained foreign body, need for additional repair, poor cosmetic result, tendon damage, vascular damage, poor wound healing and nerve damage   Alternatives discussed:  No treatment Anesthesia:    Anesthesia method:  Local infiltration   Local anesthetic:  Lidocaine 1% w/o epi Laceration details:    Location:  Face   Face location:  R eyebrow   Length (cm):  3   Depth (mm):  1 Pre-procedure details:    Preparation:  Patient was prepped and draped in usual sterile fashion Exploration:    Imaging obtained: x-ray     Imaging outcome: foreign body not noted     Wound exploration: wound explored through full range of motion     Contaminated: no   Treatment:    Area cleansed with:  Saline   Amount of cleaning:  Standard   Irrigation solution:  Sterile saline   Irrigation method:  Syringe   Visualized foreign bodies/material removed: no     Debridement:  None   Undermining:  None   Scar revision: no   Skin repair:    Repair method:  Sutures   Suture size:  4-0   Suture material:  Prolene   Suture technique:  Simple interrupted   Number of sutures:  1 Approximation:    Approximation:  Close Repair type:    Repair type:  Simple Post-procedure details:    Dressing:  Bulky dressing   Procedure completion:  Tolerated well, no immediate complications  .Critical Care Performed by: Marcello Fennel, PA-C Authorized by: Marcello Fennel, PA-C   Critical care provider statement:    Critical care time (minutes):  45   Critical care time was exclusive of:  Separately billable procedures and treating other patients   Critical care was necessary to treat or prevent imminent or life-threatening deterioration of the following conditions:  CNS failure or compromise and respiratory failure    Critical care was time spent personally by me on the following activities:  Discussions with consultants, evaluation of patient's response to treatment, examination of patient, ordering and performing treatments and interventions, ordering and review of laboratory studies, ordering and review of radiographic studies, pulse oximetry, re-evaluation of patient's condition and review of old charts   I assumed direction of critical care for this patient from another provider in my specialty: yes     Care discussed with: admitting provider     (including critical care time)  Medications Ordered in ED Medications  traMADol (ULTRAM) tablet 50 mg (has  no administration in time range)  acetaminophen (TYLENOL) tablet 650 mg (650 mg Oral Patient Refused/Not Given 11/27/20 1704)  methocarbamol (ROBAXIN) tablet 500 mg (has no administration in time range)  lidocaine (LIDODERM) 5 % 1 patch (1 patch Transdermal Patch Applied 11/27/20 1715)  bacitracin ointment (1 application Topical Given 11/27/20 1719)  metoprolol tartrate (LOPRESSOR) injection 5 mg (has no administration in time range)  brimonidine (ALPHAGAN) 0.15 % ophthalmic solution 1 drop (has no administration in time range)  dorzolamide-timolol (COSOPT) 22.3-6.8 MG/ML ophthalmic solution 1 drop (has no administration in time range)  latanoprost (XALATAN) 0.005 % ophthalmic solution 1 drop (has no administration in time range)  iohexol (OMNIPAQUE) 300 MG/ML solution 75 mL (75 mLs Intravenous Contrast Given 11/27/20 1620)  lidocaine (PF) (XYLOCAINE) 1 % injection 5 mL (5 mLs Infiltration Given by Other 11/27/20 1718)    ED Course  I have reviewed the triage vital signs and the nursing notes.  Pertinent labs & imaging results that were available during my care of the patient were reviewed by me and considered in my medical decision making (see chart for details).    MDM Rules/Calculators/A&P                          Patient presents after having a  syncopal episode.  She is alert, does not appear in acute distress, vital signs reassuring.  Due to mechanism of fall with laceration above right eyebrow, will obtain CT imaging of head, maxillofacial, CT spine.  Will also obtain generalized work-up for syncopal episode.  Due to acute head bleed with rib fractures will consult with neurosurgery as well as trauma for further recommendations.  Spoke with Barkley Boards, PA-C with trauma who agrees patient needs to be admitted for further observation.  Her team will be available for consult if needed.  Spoke with Dr. Venetia Constable who agrees patient needs to be admitted for further observation, he recommends repeat CT head tomorrow for further assessment.  He will be available for consult if needed.  Will speak with hospitalist for medicine admission.  Spoke with Dr. Leonides Schanz who has agreed to accept the patient.  He will come down and evaluate.  CBC negative for leukocytosis or signs of anemia.  CMP shows no electrolyte abnormalities, shows metabolic acidosis with a CO2 of 21, hyperglycemia 116, elevated BUN 28, elevated creatinine of 1.21 at baseline for patient, no elevated liver enzymes, no AKI, no anion gap present.  This troponin is 17.  EKG sinus rhythm without signs of ischemia no ST elevation or depression noted.  Chest x-ray shows subtle 5 mm nodule density within the periphery of the right mid lung, may represent infection versus inflammation versus pulmonary nodule.  Recommends close follow-up.  Possible fourth and fifth rib fracture. CT head shows acute subdural hematoma overlying the right frontal lobe, measuring approximately 6 mm in thickness, minimal mass-effect 1 to 2 mm left forward mid shift.  Trace amount of acute subdural hemorrhage along the right aspect of the anterior falx.  Sizable right anterior scalp hematoma extending to the right periorbital region.  CT maxillofacial negative for acute findings, CT C-spine shows no acute  fractures.  Will recommend suturing to decrease infection risk and to assist with the healing process.  Patient was agreeable with this and tolerated the procedure well. she received 1 sutures.  Neurovascular was fully intact after the procedure.   Low suspicion for systemic infection causing patient's syncopal episode as patient is  nontoxic-appearing, vital signs reassuring, no obvious source infection on my exam.  Low suspicion for intra-abdominal abnormality requiring immediate intervention as patient denies abdominal pain, tolerating p.o., no peritoneal signs on my exam.  Low suspicion for cardiac abnormality or arrhythmias causing patient's syncopal episode as patient has chest pain, shortness of breath, no signs of hypoperfusion fluid overload noted on my exam, EKG is sinus rhythm at signs of ischemia.  low suspicion for PE causing patient's syncopal episode as she denies pleuritic chest pain, shortness of breath, vital signs reassuring, no leg edema noted on my exam.  I suspect patient's elevated blood pressure secondary to subdural hematoma.  Differential diagnosis includes seizure disorder, vasovagal, POTS syndrome.  Anticipate patient will need further observation to evaluate patient's subdural bleed as well as to monitor  Rib fractures.   Patient CARE be handled from admission team for further observation     Final Clinical Impression(s) / ED Diagnoses Final diagnoses:  Acute subdural hematoma ([place])  Closed fracture of multiple ribs of right side, initial encounter    Rx / DC Orders ED Discharge Orders    None       Aron Baba 11/27/20 1900    Davonna Belling, MD 11/28/20 [phone]

## 2020-11-28 ENCOUNTER — Observation Stay (HOSPITAL_COMMUNITY): Payer: Medicare Other

## 2020-11-28 DIAGNOSIS — S0181XA Laceration without foreign body of other part of head, initial encounter: Secondary | ICD-10-CM | POA: Diagnosis not present

## 2020-11-28 DIAGNOSIS — S0003XA Contusion of scalp, initial encounter: Secondary | ICD-10-CM | POA: Diagnosis not present

## 2020-11-28 DIAGNOSIS — S065X9A Traumatic subdural hemorrhage with loss of consciousness of unspecified duration, initial encounter: Secondary | ICD-10-CM | POA: Diagnosis not present

## 2020-11-28 DIAGNOSIS — S065X0A Traumatic subdural hemorrhage without loss of consciousness, initial encounter: Secondary | ICD-10-CM | POA: Diagnosis not present

## 2020-11-28 DIAGNOSIS — S2241XA Multiple fractures of ribs, right side, initial encounter for closed fracture: Secondary | ICD-10-CM | POA: Diagnosis not present

## 2020-11-28 LAB — COMPREHENSIVE METABOLIC PANEL
ALT: 14 U/L (ref 0–44)
AST: 23 U/L (ref 15–41)
Albumin: 3.4 g/dL — ABNORMAL LOW (ref 3.5–5.0)
Alkaline Phosphatase: 49 U/L (ref 38–126)
Anion gap: 10 (ref 5–15)
BUN: 23 mg/dL (ref 8–23)
CO2: 22 mmol/L (ref 22–32)
Calcium: 9.3 mg/dL (ref 8.9–10.3)
Chloride: 105 mmol/L (ref 98–111)
Creatinine, Ser: 1.15 mg/dL — ABNORMAL HIGH (ref 0.44–1.00)
GFR, Estimated: 45 mL/min — ABNORMAL LOW (ref >60)
Glucose, Bld: 108 mg/dL — ABNORMAL HIGH (ref 70–99)
Potassium: 4.2 mmol/L (ref 3.5–5.1)
Sodium: 137 mmol/L (ref 135–145)
Total Bilirubin: 1 mg/dL (ref 0.3–1.2)
Total Protein: 6.1 g/dL — ABNORMAL LOW (ref 6.5–8.1)

## 2020-11-28 LAB — CBC
HCT: 37 % (ref 36.0–46.0)
Hemoglobin: 12.1 g/dL (ref 12.0–15.0)
MCH: 32.2 pg (ref 26.0–34.0)
MCHC: 32.7 g/dL (ref 30.0–36.0)
MCV: 98.4 fL (ref 80.0–100.0)
Platelets: 202 10*3/uL (ref 150–400)
RBC: 3.76 MIL/uL — ABNORMAL LOW (ref 3.87–5.11)
RDW: 11.7 % (ref 11.5–15.5)
WBC: 10.6 10*3/uL — ABNORMAL HIGH (ref 4.0–10.5)
nRBC: 0 % (ref 0.0–0.2)

## 2020-11-28 NOTE — ED Notes (Signed)
Lunch Tray Ordered @ 1046. 

## 2020-11-28 NOTE — Discharge Planning (Signed)
RNCM confirmed with Valerie Molina at [facility] that pt would receive PT at their facility and that they will accept her back.

## 2020-11-28 NOTE — ED Notes (Signed)
Patient verbalizes understanding of discharge instructions. Opportunity for questioning and answers were provided. Arm band removed by staff, patient discharged from ED. 

## 2020-11-28 NOTE — Progress Notes (Signed)
11/28/20 1131  PT Evaluation Information  Last PT Received On 11/28/20  Assistance Needed +1  PT/OT/SLP Co-Evaluation/Treatment Yes  Reason for Co-Treatment For patient/therapist safety  PT goals addressed during session Balance;Mobility/safety with mobility  History of Present Illness This 86 y.o. female admitted after ground level fall in [facility].  Pt with no recollection of the fall.  CT of her head showed an acute SDH in the Rt frontal lobe. She also sustained Rt anterior rib fx 4-5.  PMH includes:  h/o falls, Lt shoulder fx > s/p reverse TSA, HTN  Precautions  Precautions Fall  Restrictions  Weight Bearing Restrictions No  Home Living  Family/patient expects to be discharged to: Private residence  Living Arrangements Alone  Available Help at Discharge Family  Type of [place] One level  Bathroom Shower/Tub Walk-in shower  [place] bars - toilet;Grab bars - tub/shower;Cane - single point;Shower seat  Additional Comments Lives at [facility]  Prior Function  Level of Independence Independent  Communication  Communication No difficulties  Pain Assessment  Pain Assessment Faces  Faces Pain Scale 2  Pain Location Rt ribs  Pain Descriptors / Indicators Aching;Grimacing;Guarding  Pain Intervention(s) Limited activity within patient's tolerance;Monitored during session;Repositioned  Cognition  Arousal/Alertness Awake/alert  Behavior During Therapy WFL for tasks assessed/performed  Overall Cognitive Status Within Functional Limits for tasks assessed  Upper Extremity Assessment  Upper Extremity Assessment Defer to OT evaluation  Lower Extremity Assessment  Lower Extremity Assessment Generalized weakness  Cervical / Trunk Assessment  Cervical / Trunk Assessment Kyphotic  Bed Mobility  Overal bed mobility Modified Independent  Transfers  Overall transfer  level Modified independent  Equipment used None  General transfer comment pt with mild balance deficit, but can perform mod I  Ambulation/Gait  Ambulation/Gait assistance Supervision;Min guard  Gait Distance (Feet) 120 Feet  Assistive device None  Gait Pattern/deviations Drifts right/left;Decreased stride length;Step-through pattern  General Gait Details Pt with mild instability noted and tended to weave to L and R. No overt LOB noted. Noted instability with horizontal and vertical head turns as well. Eductaed about using cane for safety at home.  Gait velocity Decreased  Balance  Overall balance assessment Mild deficits observed, not formally tested  General Comments  General comments (skin integrity, edema, etc.) BP supine 128/60, HR 69; sitting 147/76, HR 70; standing 124/70, HR 71  PT - End of Session  Equipment Utilized During Treatment Gait belt  Activity Tolerance Patient tolerated treatment well  Patient left in bed;with call bell/phone within reach (on stretcher in ED)  Nurse Communication Mobility status  PT Assessment  PT Recommendation/Assessment All further PT needs can be met in the next venue of care  PT Visit Diagnosis Unsteadiness on feet (R26.81);History of falling (Z91.81)  PT Problem List Decreased balance;Decreased strength;Decreased mobility;Decreased knowledge of use of DME  AM-PAC PT "6 Clicks" Mobility Outcome Measure (Version 2)  Help needed turning from your back to your side while in a flat bed without using bedrails? 4  Help needed moving from lying on your back to sitting on the side of a flat bed without using bedrails? 4  Help needed moving to and from a bed to a chair (including a wheelchair)? 4  Help needed standing up from a chair using your arms (e.g., wheelchair or bedside chair)? 4  Help needed to walk in hospital room? 3  Help needed climbing  3-5 steps with a railing?  3  6 Click Score 22  Consider Recommendation of Discharge To: Home with no  services  PT Recommendation  Follow Up Recommendations Home health PT (PT at [facility])  PT equipment None recommended by PT  Individuals Consulted  Consulted and Agree with Results and Recommendations Patient  Acute Rehab PT Goals  Patient Stated Goal to go back to [facility] today  PT Goal Formulation With patient  Time For Goal Achievement 11/28/20  Potential to Achieve Goals Good  PT Time Calculation  PT Start Time (ACUTE ONLY) 1003  PT Stop Time (ACUTE ONLY) 1027  PT Time Calculation (min) (ACUTE ONLY) 24 min  PT General Charges  $$ ACUTE PT VISIT 1 Visit  PT Evaluation  $PT Eval Low Complexity 1 Low    Pt presenting with problem above and deficits below. Pt requiring min guard to supervision for gait. Noted mild instability when performing dynamic gait tasks, however, no overt LOB noted. Feel pt would benefit from follow up PT at [facility]. Pt reports she plans to d/c today, so no further PT needs. Will sign off. If needs change, please re-consult.   Reuel Derby, PT, DPT  Acute Rehabilitation Services  Pager: [phone] Office: [phone]

## 2020-11-28 NOTE — ED Notes (Signed)
Pt taken for CT 

## 2020-11-28 NOTE — ED Notes (Signed)
Notified Dr. Doristine Bosworth d/t pt's BP's trending around 105/52 and scheduled to receive Norvasc and Losartan. Per Dr. Doristine Bosworth, ok to hold both medications for now.

## 2020-11-28 NOTE — Discharge Summary (Addendum)
Physician Discharge Summary  Valerie Molina [id number] DOB: [birth year] DOA: 11/27/2020  PCP: Haywood Pao, MD  Admit date: 11/27/2020 Discharge date: 11/28/2020  Admitted From: Independent living facility Disposition: Independent living facility with home PT  Recommendations for Outpatient Follow-up:  1. Follow up with PCP in 1-2 weeks 2. Follow-up with neurosurgery/Dr. Venetia Constable in 2 weeks 3. Please obtain BMP/CBC in one week 4. Please follow up with your PCP on the following pending results: Unresulted Labs (From admission, onward)         None       Home Health: Yes Equipment/Devices: None  Discharge Condition: Stable CODE STATUS: Full code Diet recommendation: Cardiac  Subjective: Patient seen and examined this morning.  Still in the ED.  She states that she was feeling much better.  She denied any headache, dizziness, shortness of breath or any chest pain unless she moves.  Despite of this, she was very comfortable, stable and wanted to go home.  HPI: Valerie Molina is a 86 y.o. female with hx of hypertension, glaucoma who presented to the hospital on 11/27/2020 secondary to ground-level fall and forehead laceration.  Patient states earlier today she had routine dental work, cleaning done and she went back to her facility and she believes she passed out and elevator and hit her head.  She does remember the incident but remembers waking up with paramedics around her.  She had a laceration on her right forehead.  EMS was called.  Her blood pressure been stable.  She is not on any blood thinners at home.  She denies any similar symptoms in the past.  She did fall several months ago, but she did remember this fall, says she tripped over her shoe, and did break her humerus and required surgery.  Her son who is bedside believes this was a mechanical fall, not a syncopal event, but he was not there.  ED course: -Initial presentation, afebrile, regular rate, slightly  hypertensive. -Relevant labs on initial presentation: Sodium 139, potassium 4.7, chloride 103, bicarb 21, glucose 116, creatinine 1.21, BUN 28, WBC 9.8, hemoglobin 12.5, platelets 201 -CT of the head demonstrated acute subdural hematoma in the right frontal lobe measuring 6 mm in thickness. -Chest x-ray demonstrates fourth and fifth rib fractures -Neurosurgery contacted in the ED, recommend being brought in for observation with repeat CT head in the morning -Trauma surgery contacted in ED, recommended being brought in for observation, pain control, monitoring -Patient denies any shortness of breath, abdominal pain, nausea, vomiting, dyspnea at the time of my exam. -Forehead laceration was sutured by ED provider -The hospitalist service was contacted for further evaluation, management, and bringing the patient in for observation  Brief/Interim Summary: Patient was admitted under hospital service secondary to fall leading to trauma and causing right forehead laceration, right subdural hematoma which was 6 mm as well as right fourth and fifth rib fracture.  Trauma as well as neurosurgery were consulted.  Repeat chest x-ray was done per trauma which did not show any change.  Patient was not hypoxic despite of having rib fractures and her pain was very well controlled.  She had no shortness of breath.  Trauma cleared the patient for discharge.  CT head was repeated per neurosurgery recommendation which showed improved subdural hematoma from 25mm to 3 mm and she was cleared by neurosurgery as well with instructions to follow-up in 2 weeks.  She was evaluated by OT who did not recommend any home OT but PT recommended home  PT which is arranged for her by our [facility] staff.  She wants to go home and she is stable so she is going to be discharged today.  Patient will be given incentive spirometry here, educated on this and she will take that with home.  Patient's primary RN has been instructed to do that.  Discharge  Diagnoses:  Active Problems:   Subdural hematoma (HCC)   Multiple rib fractures   Fall from ground level   Forehead laceration, initial encounter    Discharge Instructions   Allergies as of 11/28/2020   No Known Allergies     Medication List    TAKE these medications   acetaminophen 325 MG tablet Commonly known as: TYLENOL Take 1-2 tablets (325-650 mg total) by mouth every 6 (six) hours as needed for mild pain (pain score 1-3 or temp > 100.5).   Alphagan P 0.1 % Soln Generic drug: brimonidine Apply 1 drop to eye 2 (two) times daily.   amLODipine 5 MG tablet Commonly known as: NORVASC Take 5 mg by mouth daily.   amoxicillin 500 MG capsule Commonly known as: AMOXIL Take 1,000 mg by mouth 2 (two) times daily. FOR 5 DAYS   dorzolamide-timolol 22.3-6.8 MG/ML ophthalmic solution Commonly known as: COSOPT Place 1 drop into both eyes 2 (two) times daily.   latanoprost 0.005 % ophthalmic solution Commonly known as: XALATAN Place 1 drop into both eyes at bedtime.   levothyroxine 50 MCG tablet Commonly known as: SYNTHROID Take 50 mcg by mouth daily.   losartan 50 MG tablet Commonly known as: COZAAR Take 50 mg by mouth daily.   multivitamin with minerals tablet Take 1 tablet by mouth daily.   spironolactone 25 MG tablet Commonly known as: ALDACTONE Take 25 mg by mouth daily.       Follow-up Information    Tisovec, Fransico Him, MD Follow up in 1 week(s).   Specialty: Internal Medicine Contact information: [address] [place] [address] [phone]              No Known Allergies  Consultations: Neurosurgery and trauma surgery   Procedures/Studies: DG Chest 2 View  Result Date: 11/27/2020 CLINICAL DATA:  Posterior right chest and rib pain after fall EXAM: CHEST - 2 VIEW COMPARISON:  08/27/2020 FINDINGS: Stable cardiomediastinal contours. Atherosclerotic calcification of the aortic knob. Subtle 5 mm somewhat nodular density within the  periphery of the right mid lung, which appears new from prior. The lungs are otherwise clear. No pleural effusion or pneumothorax. Reverse left shoulder arthroplasty. Possible nondisplaced fractures of the anterolateral aspects of the right fourth and fifth ribs. IMPRESSION: 1. Subtle 5 mm nodular density within the periphery of the right mid lung, new from prior. Findings may reflect a focus of inflection or inflammation versus a pulmonary nodule. Recommend short-term follow-up imaging to ensure resolution. 2. Possible nondisplaced fractures of the anterolateral aspects of the right fourth and fifth ribs. Correlate with point tenderness. 3. No pneumothorax. Electronically Signed   By: Davina Poke D.O.   On: 11/27/2020 14:22   DG Pelvis 1-2 Views  Result Date: 11/27/2020 CLINICAL DATA:  Fall.  Denies hip or pelvic pain. EXAM: PELVIS - 1-2 VIEW COMPARISON:  Pelvis x-ray dated August 27, 2020. FINDINGS: There is no evidence of pelvic fracture or diastasis. No pelvic bone lesions are seen. IMPRESSION: Negative. Electronically Signed   By: Titus Dubin M.D.   On: 11/27/2020 16:44   CT HEAD WO CONTRAST  Result Date: 11/28/2020 CLINICAL DATA:  Subdural hematoma follow-up. EXAM: CT HEAD WITHOUT CONTRAST TECHNIQUE: Contiguous axial images were obtained from the base of the skull through the vertex without intravenous contrast. COMPARISON:  November 27, 2020. FINDINGS: Brain: Interval decrease in the size of the right convexity subdural hemorrhage with small volume (3 mm thick) hemorrhage layering posteriorly along the right parieto-occipital convexity, likely redistributed blood. No substantial midline shift. No convincing new acute hemorrhage. No evidence of acute large vascular territory infarct. No hydrocephalus. Mild generalized atrophy with ex vacuo ventricular dilation, similar. No mass lesion. Vascular: Calcific atherosclerosis. Skull: No acute fracture. Decreased size of a right  frontal/periorbital scalp contusion. Sinuses/Orbits: Mild ethmoid air cell mucosal thickening. Unremarkable orbits. Other: No mastoid effusions. IMPRESSION: 1. Interval decrease in the size of the right convexity subdural hemorrhage with small volume (3 mm thick) hemorrhage layering posteriorly along the right parieto-occipital convexity, likely redistributed blood. 2. Decreased size of a right frontal/periorbital scalp contusion. Electronically Signed   By: Margaretha Sheffield MD   On: 11/28/2020 10:08   CT Head Wo Contrast  Result Date: 11/27/2020 CLINICAL DATA:  Facial trauma. Neck trauma. Syncopal episode, hematoma over right eye. EXAM: CT HEAD WITHOUT CONTRAST CT MAXILLOFACIAL WITHOUT CONTRAST CT CERVICAL SPINE WITHOUT CONTRAST TECHNIQUE: Multidetector CT imaging of the head, cervical spine, and maxillofacial structures were performed using the standard protocol without intravenous contrast. Multiplanar CT image reconstructions of the cervical spine and maxillofacial structures were also generated. COMPARISON:  CT head/cervical spine/maxillofacial 08/27/2020. FINDINGS: CT HEAD FINDINGS Brain: Mild cerebral and cerebellar atrophy. Acute subdural hematoma overlying the anterior right frontal lobe/frontal operculum and right temporal lobe measuring up to 6 mm in thickness (for instance as seen on series 5, image 21). Minimal mass effect upon the underlying right cerebral hemisphere. Trace 1-2 mm leftward midline shift at the level of the septum pellucidum. Additional trace acute subdural hemorrhage along the right aspect of the falx anteriorly. Ill-defined hypoattenuation within the cerebral white matter is nonspecific, but compatible with chronic small vessel ischemic disease. Chronic small vessel infarcts within the deep frontal white matter bilaterally. No demarcated cortical infarct. No evidence of intracranial mass. Vascular: No hyperdense vessel.  Atherosclerotic calcifications. Skull: Normal. Negative  for fracture or focal lesion. Other: Sizable right anterior scalp hematoma extending to the right periorbital region. CT MAXILLOFACIAL FINDINGS Osseous: No acute maxillofacial fracture is identified. Redemonstrated chronic fracture deformity of the maxillary alveolar ridge on the right. Orbits: Right periorbital hematoma. No other acute finding. The globes are normal in size and contour. The extraocular muscles and optic nerve sheath complexes are symmetric and unremarkable. Sinuses: Trace bilateral ethmoid and maxillary sinus mucosal thickening. Soft tissues: Sizable hematoma within the right anterior scalp and extending to the right periorbital region. CT CERVICAL SPINE FINDINGS Alignment: Trace C4-C5 grade 1 retrolisthesis. Skull base and vertebrae: The basion-dental and atlanto-dental intervals are maintained.No evidence of acute fracture to the cervical spine. Soft tissues and spinal canal: No prevertebral fluid or swelling. No visible canal hematoma. Disc levels: Cervical spondylosis with multilevel disc space narrowing, disc bulges and uncovertebral hypertrophy. Disc space narrowing is severe at C3-C4. Upper chest: No consolidation within the imaged lung apices. No visible pneumothorax. These results were called by telephone at the time of interpretation on 11/27/2020 at 3:38 pm to provider Dr. Alvino Chapel, who verbally acknowledged these results. IMPRESSION: CT head: 1. Acute subdural hematoma overlying the anterior right frontal lobe/frontal operculum and right temporal lobe, measuring 6 mm in thickness. Minimal mass effect upon the underlying right cerebral hemisphere.  Trace 1-2 mm leftward midline shift. 2. Trace acute subdural hemorrhage along the right aspect of the anterior falx. 3. Sizable right anterior scalp hematoma extending to the right periorbital region. 4. Generalized atrophy of the brain and chronic small vessel ischemic disease. CT maxillofacial: 1. No evidence of acute maxillofacial fracture.  2. Sizable right anterior scalp hematoma extending to the right periorbital region. CT cervical spine: 1. No evidence of acute fracture to the cervical spine. 2. Unchanged mild C4-C5 grade 1 retrolisthesis. 3. Cervical spondylosis as described. Electronically Signed   By: Kellie Simmering DO   On: 11/27/2020 15:40   CT Chest W Contrast  Result Date: 11/27/2020 CLINICAL DATA:  Found down, fell, posterior right chest pain EXAM: CT CHEST WITH CONTRAST TECHNIQUE: Multidetector CT imaging of the chest was performed during intravenous contrast administration. CONTRAST:  31mL OMNIPAQUE IOHEXOL 300 MG/ML  SOLN COMPARISON:  11/27/2020 FINDINGS: Cardiovascular: The heart and great vessels are unremarkable without pericardial effusion. Moderate coronary artery atherosclerosis. No evidence of vascular injury. Normal caliber of the thoracic aorta, with diffuse atherosclerosis. Mediastinum/Nodes: No enlarged mediastinal, hilar, or axillary lymph nodes. Thyroid gland, trachea, and esophagus demonstrate no significant findings. Lungs/Pleura: No acute airspace disease, effusion, or pneumothorax. Dependent hypoventilatory changes are seen within the lower lobes. Multiple nodules are seen within the right upper lobe abutting the minor fissure, largest measuring 15 x 7 x 12 mm. No other pulmonary nodules or masses. The central airways are patent. Upper Abdomen: Bilateral renal cortical thinning. Indeterminate renal hypodensities likely reflects cysts. No acute upper abdominal findings. Musculoskeletal: There are nondisplaced right anterior fourth and fifth rib fractures corresponding to chest x-ray findings. No other acute bony abnormalities. Reconstructed images are unremarkable. IMPRESSION: 1. Nondisplaced right anterior fourth and fifth rib fractures. 2. Multiple contiguous right upper lobe pulmonary nodules, largest measuring 11 mm in mean diameter. Non-contrast chest CT at 3-6 months is recommended. If the nodules are stable at  time of repeat CT, then future CT at 18-24 months (from today's scan) is considered optional for low-risk patients, but is recommended for high-risk patients. This recommendation follows the consensus statement: Guidelines for Management of Incidental Pulmonary Nodules Detected on CT Images: From the Fleischner Society 2017; Radiology 2017; 284:228-243. 3. Aortic Atherosclerosis (ICD10-I70.0). Coronary artery atherosclerosis. Electronically Signed   By: Randa Ngo M.D.   On: 11/27/2020 16:31   CT Cervical Spine Wo Contrast  Result Date: 11/27/2020 CLINICAL DATA:  Facial trauma. Neck trauma. Syncopal episode, hematoma over right eye. EXAM: CT HEAD WITHOUT CONTRAST CT MAXILLOFACIAL WITHOUT CONTRAST CT CERVICAL SPINE WITHOUT CONTRAST TECHNIQUE: Multidetector CT imaging of the head, cervical spine, and maxillofacial structures were performed using the standard protocol without intravenous contrast. Multiplanar CT image reconstructions of the cervical spine and maxillofacial structures were also generated. COMPARISON:  CT head/cervical spine/maxillofacial 08/27/2020. FINDINGS: CT HEAD FINDINGS Brain: Mild cerebral and cerebellar atrophy. Acute subdural hematoma overlying the anterior right frontal lobe/frontal operculum and right temporal lobe measuring up to 6 mm in thickness (for instance as seen on series 5, image 21). Minimal mass effect upon the underlying right cerebral hemisphere. Trace 1-2 mm leftward midline shift at the level of the septum pellucidum. Additional trace acute subdural hemorrhage along the right aspect of the falx anteriorly. Ill-defined hypoattenuation within the cerebral white matter is nonspecific, but compatible with chronic small vessel ischemic disease. Chronic small vessel infarcts within the deep frontal white matter bilaterally. No demarcated cortical infarct. No evidence of intracranial mass. Vascular: No hyperdense vessel.  Atherosclerotic calcifications. Skull: Normal.  Negative for fracture or focal lesion. Other: Sizable right anterior scalp hematoma extending to the right periorbital region. CT MAXILLOFACIAL FINDINGS Osseous: No acute maxillofacial fracture is identified. Redemonstrated chronic fracture deformity of the maxillary alveolar ridge on the right. Orbits: Right periorbital hematoma. No other acute finding. The globes are normal in size and contour. The extraocular muscles and optic nerve sheath complexes are symmetric and unremarkable. Sinuses: Trace bilateral ethmoid and maxillary sinus mucosal thickening. Soft tissues: Sizable hematoma within the right anterior scalp and extending to the right periorbital region. CT CERVICAL SPINE FINDINGS Alignment: Trace C4-C5 grade 1 retrolisthesis. Skull base and vertebrae: The basion-dental and atlanto-dental intervals are maintained.No evidence of acute fracture to the cervical spine. Soft tissues and spinal canal: No prevertebral fluid or swelling. No visible canal hematoma. Disc levels: Cervical spondylosis with multilevel disc space narrowing, disc bulges and uncovertebral hypertrophy. Disc space narrowing is severe at C3-C4. Upper chest: No consolidation within the imaged lung apices. No visible pneumothorax. These results were called by telephone at the time of interpretation on 11/27/2020 at 3:38 pm to provider Dr. Alvino Chapel, who verbally acknowledged these results. IMPRESSION: CT head: 1. Acute subdural hematoma overlying the anterior right frontal lobe/frontal operculum and right temporal lobe, measuring 6 mm in thickness. Minimal mass effect upon the underlying right cerebral hemisphere. Trace 1-2 mm leftward midline shift. 2. Trace acute subdural hemorrhage along the right aspect of the anterior falx. 3. Sizable right anterior scalp hematoma extending to the right periorbital region. 4. Generalized atrophy of the brain and chronic small vessel ischemic disease. CT maxillofacial: 1. No evidence of acute maxillofacial  fracture. 2. Sizable right anterior scalp hematoma extending to the right periorbital region. CT cervical spine: 1. No evidence of acute fracture to the cervical spine. 2. Unchanged mild C4-C5 grade 1 retrolisthesis. 3. Cervical spondylosis as described. Electronically Signed   By: Kellie Simmering DO   On: 11/27/2020 15:40   CT Maxillofacial WO CM  Result Date: 11/27/2020 CLINICAL DATA:  Facial trauma. Neck trauma. Syncopal episode, hematoma over right eye. EXAM: CT HEAD WITHOUT CONTRAST CT MAXILLOFACIAL WITHOUT CONTRAST CT CERVICAL SPINE WITHOUT CONTRAST TECHNIQUE: Multidetector CT imaging of the head, cervical spine, and maxillofacial structures were performed using the standard protocol without intravenous contrast. Multiplanar CT image reconstructions of the cervical spine and maxillofacial structures were also generated. COMPARISON:  CT head/cervical spine/maxillofacial 08/27/2020. FINDINGS: CT HEAD FINDINGS Brain: Mild cerebral and cerebellar atrophy. Acute subdural hematoma overlying the anterior right frontal lobe/frontal operculum and right temporal lobe measuring up to 6 mm in thickness (for instance as seen on series 5, image 21). Minimal mass effect upon the underlying right cerebral hemisphere. Trace 1-2 mm leftward midline shift at the level of the septum pellucidum. Additional trace acute subdural hemorrhage along the right aspect of the falx anteriorly. Ill-defined hypoattenuation within the cerebral white matter is nonspecific, but compatible with chronic small vessel ischemic disease. Chronic small vessel infarcts within the deep frontal white matter bilaterally. No demarcated cortical infarct. No evidence of intracranial mass. Vascular: No hyperdense vessel.  Atherosclerotic calcifications. Skull: Normal. Negative for fracture or focal lesion. Other: Sizable right anterior scalp hematoma extending to the right periorbital region. CT MAXILLOFACIAL FINDINGS Osseous: No acute maxillofacial fracture  is identified. Redemonstrated chronic fracture deformity of the maxillary alveolar ridge on the right. Orbits: Right periorbital hematoma. No other acute finding. The globes are normal in size and contour. The extraocular muscles and optic nerve sheath complexes are  symmetric and unremarkable. Sinuses: Trace bilateral ethmoid and maxillary sinus mucosal thickening. Soft tissues: Sizable hematoma within the right anterior scalp and extending to the right periorbital region. CT CERVICAL SPINE FINDINGS Alignment: Trace C4-C5 grade 1 retrolisthesis. Skull base and vertebrae: The basion-dental and atlanto-dental intervals are maintained.No evidence of acute fracture to the cervical spine. Soft tissues and spinal canal: No prevertebral fluid or swelling. No visible canal hematoma. Disc levels: Cervical spondylosis with multilevel disc space narrowing, disc bulges and uncovertebral hypertrophy. Disc space narrowing is severe at C3-C4. Upper chest: No consolidation within the imaged lung apices. No visible pneumothorax. These results were called by telephone at the time of interpretation on 11/27/2020 at 3:38 pm to provider Dr. Alvino Chapel, who verbally acknowledged these results. IMPRESSION: CT head: 1. Acute subdural hematoma overlying the anterior right frontal lobe/frontal operculum and right temporal lobe, measuring 6 mm in thickness. Minimal mass effect upon the underlying right cerebral hemisphere. Trace 1-2 mm leftward midline shift. 2. Trace acute subdural hemorrhage along the right aspect of the anterior falx. 3. Sizable right anterior scalp hematoma extending to the right periorbital region. 4. Generalized atrophy of the brain and chronic small vessel ischemic disease. CT maxillofacial: 1. No evidence of acute maxillofacial fracture. 2. Sizable right anterior scalp hematoma extending to the right periorbital region. CT cervical spine: 1. No evidence of acute fracture to the cervical spine. 2. Unchanged mild C4-C5  grade 1 retrolisthesis. 3. Cervical spondylosis as described. Electronically Signed   By: Kellie Simmering DO   On: 11/27/2020 15:40      Discharge Exam: Vitals:   11/28/20 1030 11/28/20 1100  BP: (!) 145/56 (!) 159/59  Pulse: 65 69  Resp: 15 13  Temp:    SpO2: 99% 98%   Vitals:   11/28/20 0900 11/28/20 0930 11/28/20 1030 11/28/20 1100  BP: (!) 106/52 (!) 105/52 (!) 145/56 (!) 159/59  Pulse: 69 72 65 69  Resp: 13 (!) 24 15 13   Temp:      TempSrc:      SpO2: 99% 98% 99% 98%    General: Pt is alert, awake, not in acute distress Cardiovascular: RRR, S1/S2 +, no rubs, no gallops Respiratory: CTA bilaterally, no wheezing, no rhonchi Abdominal: Soft, NT, ND, bowel sounds + Extremities: no edema, no cyanosis    The results of significant diagnostics from this hospitalization (including imaging, microbiology, ancillary and laboratory) are listed below for reference.     Microbiology: Recent Results (from the past 240 hour(s))  Resp Panel by RT-PCR (Flu A&B, Covid) Nasopharyngeal Swab     Status: None   Collection Time: 11/27/20  4:53 PM   Specimen: Nasopharyngeal Swab; Nasopharyngeal(NP) swabs in vial transport medium  Result Value Ref Range Status   SARS Coronavirus 2 by RT PCR NEGATIVE NEGATIVE Final    Comment: (NOTE) SARS-CoV-2 target nucleic acids are NOT DETECTED.  The SARS-CoV-2 RNA is generally detectable in upper respiratory specimens during the acute phase of infection. The lowest concentration of SARS-CoV-2 viral copies this assay can detect is 138 copies/mL. A negative result does not preclude SARS-Cov-2 infection and should not be used as the sole basis for treatment or other patient management decisions. A negative result may occur with  improper specimen collection/handling, submission of specimen other than nasopharyngeal swab, presence of viral mutation(s) within the areas targeted by this assay, and inadequate number of viral copies(<138 copies/mL). A  negative result must be combined with clinical observations, patient history, and epidemiological information. The expected result  is Negative.  Fact Sheet for Patients:  EntrepreneurPulse.com.au  Fact Sheet for Healthcare Providers:  IncredibleEmployment.be  This test is no t yet approved or cleared by the [place] FDA and  has been authorized for detection and/or diagnosis of SARS-CoV-2 by FDA under an Emergency Use Authorization (EUA). This EUA will remain  in effect (meaning this test can be used) for the duration of the COVID-19 declaration under Section 564(b)(1) of the Act, 21 U.S.C.section 360bbb-3(b)(1), unless the authorization is terminated  or revoked sooner.       Influenza A by PCR NEGATIVE NEGATIVE Final   Influenza B by PCR NEGATIVE NEGATIVE Final    Comment: (NOTE) The Xpert Xpress SARS-CoV-2/FLU/RSV plus assay is intended as an aid in the diagnosis of influenza from Nasopharyngeal swab specimens and should not be used as a sole basis for treatment. Nasal washings and aspirates are unacceptable for Xpert Xpress SARS-CoV-2/FLU/RSV testing.  Fact Sheet for Patients: EntrepreneurPulse.com.au  Fact Sheet for Healthcare Providers: IncredibleEmployment.be  This test is not yet approved or cleared by the [place] FDA and has been authorized for detection and/or diagnosis of SARS-CoV-2 by FDA under an Emergency Use Authorization (EUA). This EUA will remain in effect (meaning this test can be used) for the duration of the COVID-19 declaration under Section 564(b)(1) of the Act, 21 U.S.C. section 360bbb-3(b)(1), unless the authorization is terminated or revoked.  Performed at [facility], [place] [address], [place], [place] [address]      Labs: BNP (last 3 results) No results for input(s): BNP in the last 8760 hours. Basic Metabolic Panel: Recent Labs  Lab 11/27/20 1425  11/28/20 0500  NA 139 137  K 4.7 4.2  CL 103 105  CO2 21* 22  GLUCOSE 116* 108*  BUN 28* 23  CREATININE 1.21* 1.15*  CALCIUM 10.5* 9.3   Liver Function Tests: Recent Labs  Lab 11/27/20 1425 11/28/20 0500  AST 21 23  ALT 14 14  ALKPHOS 52 49  BILITOT 1.0 1.0  PROT 6.6 6.1*  ALBUMIN 3.7 3.4*   No results for input(s): LIPASE, AMYLASE in the last 168 hours. No results for input(s): AMMONIA in the last 168 hours. CBC: Recent Labs  Lab 11/27/20 1425 11/28/20 0500  WBC 9.8 10.6*  NEUTROABS 7.7  --   HGB 12.5 12.1  HCT 39.0 37.0  MCV 99.5 98.4  PLT 201 202   Cardiac Enzymes: No results for input(s): CKTOTAL, CKMB, CKMBINDEX, TROPONINI in the last 168 hours. BNP: Invalid input(s): POCBNP CBG: No results for input(s): GLUCAP in the last 168 hours. D-Dimer No results for input(s): DDIMER in the last 72 hours. Hgb A1c No results for input(s): HGBA1C in the last 72 hours. Lipid Profile No results for input(s): CHOL, HDL, LDLCALC, TRIG, CHOLHDL, LDLDIRECT in the last 72 hours. Thyroid function studies No results for input(s): TSH, T4TOTAL, T3FREE, THYROIDAB in the last 72 hours.  Invalid input(s): FREET3 Anemia work up No results for input(s): VITAMINB12, FOLATE, FERRITIN, TIBC, IRON, RETICCTPCT in the last 72 hours. Urinalysis    Component Value Date/Time   COLORURINE STRAW (A) 11/27/2020 1653   APPEARANCEUR CLEAR 11/27/2020 1653   LABSPEC 1.013 11/27/2020 1653   PHURINE 7.0 11/27/2020 1653   GLUCOSEU NEGATIVE 11/27/2020 1653   HGBUR NEGATIVE 11/27/2020 1653   BILIRUBINUR NEGATIVE 11/27/2020 1653   KETONESUR 5 (A) 11/27/2020 1653   PROTEINUR NEGATIVE 11/27/2020 1653   NITRITE NEGATIVE 11/27/2020 1653   LEUKOCYTESUR NEGATIVE 11/27/2020 1653   Sepsis  Labs Invalid input(s): PROCALCITONIN,  WBC,  LACTICIDVEN Microbiology Recent Results (from the past 240 hour(s))  Resp Panel by RT-PCR (Flu A&B, Covid) Nasopharyngeal Swab     Status: None   Collection Time:  11/27/20  4:53 PM   Specimen: Nasopharyngeal Swab; Nasopharyngeal(NP) swabs in vial transport medium  Result Value Ref Range Status   SARS Coronavirus 2 by RT PCR NEGATIVE NEGATIVE Final    Comment: (NOTE) SARS-CoV-2 target nucleic acids are NOT DETECTED.  The SARS-CoV-2 RNA is generally detectable in upper respiratory specimens during the acute phase of infection. The lowest concentration of SARS-CoV-2 viral copies this assay can detect is 138 copies/mL. A negative result does not preclude SARS-Cov-2 infection and should not be used as the sole basis for treatment or other patient management decisions. A negative result may occur with  improper specimen collection/handling, submission of specimen other than nasopharyngeal swab, presence of viral mutation(s) within the areas targeted by this assay, and inadequate number of viral copies(<138 copies/mL). A negative result must be combined with clinical observations, patient history, and epidemiological information. The expected result is Negative.  Fact Sheet for Patients:  EntrepreneurPulse.com.au  Fact Sheet for Healthcare Providers:  IncredibleEmployment.be  This test is no t yet approved or cleared by the [place] FDA and  has been authorized for detection and/or diagnosis of SARS-CoV-2 by FDA under an Emergency Use Authorization (EUA). This EUA will remain  in effect (meaning this test can be used) for the duration of the COVID-19 declaration under Section 564(b)(1) of the Act, 21 U.S.C.section 360bbb-3(b)(1), unless the authorization is terminated  or revoked sooner.       Influenza A by PCR NEGATIVE NEGATIVE Final   Influenza B by PCR NEGATIVE NEGATIVE Final    Comment: (NOTE) The Xpert Xpress SARS-CoV-2/FLU/RSV plus assay is intended as an aid in the diagnosis of influenza from Nasopharyngeal swab specimens and should not be used as a sole basis for treatment. Nasal washings  and aspirates are unacceptable for Xpert Xpress SARS-CoV-2/FLU/RSV testing.  Fact Sheet for Patients: EntrepreneurPulse.com.au  Fact Sheet for Healthcare Providers: IncredibleEmployment.be  This test is not yet approved or cleared by the [place] FDA and has been authorized for detection and/or diagnosis of SARS-CoV-2 by FDA under an Emergency Use Authorization (EUA). This EUA will remain in effect (meaning this test can be used) for the duration of the COVID-19 declaration under Section 564(b)(1) of the Act, 21 U.S.C. section 360bbb-3(b)(1), unless the authorization is terminated or revoked.  Performed at [facility], [place] [address] [place], [place] [address]      Time coordinating discharge: Over 30 minutes  SIGNED:   Darliss Cheney, MD  [facility] 11/28/2020, 11:30 AM  If 7PM-7AM, please contact night-coverage [email or web address]

## 2020-11-28 NOTE — ED Notes (Signed)
Report called to [place] at [facility] at [facility].

## 2020-11-28 NOTE — Discharge Instructions (Signed)
Rib Fracture  A rib fracture is a break or crack in one of the bones of the ribs. The ribs are like a cage that goes around your upper chest. A broken or cracked rib is often painful, but most do not cause other problems. Most rib fractures usually heal on their own in 1-3 months. Follow these instructions at home: Managing pain, stiffness, and swelling  If directed, apply ice to the injured area. ? Put ice in a plastic bag. ? Place a towel between your skin and the bag. ? Leave the ice on for 20 minutes, 2-3 times a day.  Take over-the-counter and prescription medicines only as told by your doctor. Activity  Avoid activities that cause pain to the injured area. Protect your injured area.  Slowly increase activity as told by your doctor. General instructions  Do deep breathing as told by your doctor. You may be told to: ? Take deep breaths many times a day. ? Cough many times a day while hugging a pillow. ? Use a device (incentive spirometer) to do deep breathing many times a day.  Drink enough fluid to keep your pee (urine) clear or pale yellow.  Do not wear a rib belt or binder. These do not allow you to breathe deeply.  Keep all follow-up visits as told by your doctor. This is important. Contact a doctor if:  You have a fever. Get help right away if:  You have trouble breathing.  You are short of breath.  You cannot stop coughing.  You cough up thick or bloody spit (sputum).  You feel sick to your stomach (nauseous), throw up (vomit), or have belly (abdominal) pain.  Your pain gets worse and medicine does not help. Summary  A rib fracture is a break or crack in one of the bones of the ribs.  Apply ice to the injured area and take medicines for pain as told by your doctor.  Take deep breaths and cough many times a day. Hug a pillow every time you cough. This information is not intended to replace advice given to you by your health care provider. Make sure you  discuss any questions you have with your health care provider. Document Revised: 11/05/2017 Document Reviewed: 02/23/2017 Elsevier Patient Education  2020 [employer]  

## 2020-11-28 NOTE — Progress Notes (Signed)
Subjective/Chief Complaint: Patient is awake and alert No complaints - wants to go home  CXR - no change from yesterday.  No consolidation or ptx.  Objective: Vital signs in last 24 hours: Temp:  [97.8 F (36.6 C)] 97.8 F (36.6 C) (12/22 1314) Pulse Rate:  [61-87] 69 (12/23 0530) Resp:  [11-18] 12 (12/23 0530) BP: (94-180)/(47-89) 143/59 (12/23 0530) SpO2:  [94 %-99 %] 95 % (12/23 0530)    Intake/Output from previous day: 12/22 0701 - 12/23 0700 In: -  Out: 800 [Urine:800] Intake/Output this shift: No intake/output data recorded.  Patient is awake, alert, oriented Tolerating diet No chest pain with deep inspiration No chest tenderness to palpation Right forehead - minimal abrasion/ hematoma  Lab Results:  Recent Labs    11/27/20 1425 11/28/20 0500  WBC 9.8 10.6*  HGB 12.5 12.1  HCT 39.0 37.0  PLT 201 202   BMET Recent Labs    11/27/20 1425 11/28/20 0500  NA 139 137  K 4.7 4.2  CL 103 105  CO2 21* 22  GLUCOSE 116* 108*  BUN 28* 23  CREATININE 1.21* 1.15*  CALCIUM 10.5* 9.3   PT/INR Recent Labs    11/27/20 1653  LABPROT 13.0  INR 1.0   ABG No results for input(s): PHART, HCO3 in the last 72 hours.  Invalid input(s): PCO2, PO2  Studies/Results: DG Chest 2 View  Result Date: 11/27/2020 CLINICAL DATA:  Posterior right chest and rib pain after fall EXAM: CHEST - 2 VIEW COMPARISON:  08/27/2020 FINDINGS: Stable cardiomediastinal contours. Atherosclerotic calcification of the aortic knob. Subtle 5 mm somewhat nodular density within the periphery of the right mid lung, which appears new from prior. The lungs are otherwise clear. No pleural effusion or pneumothorax. Reverse left shoulder arthroplasty. Possible nondisplaced fractures of the anterolateral aspects of the right fourth and fifth ribs. IMPRESSION: 1. Subtle 5 mm nodular density within the periphery of the right mid lung, new from prior. Findings may reflect a focus of inflection or  inflammation versus a pulmonary nodule. Recommend short-term follow-up imaging to ensure resolution. 2. Possible nondisplaced fractures of the anterolateral aspects of the right fourth and fifth ribs. Correlate with point tenderness. 3. No pneumothorax. Electronically Signed   By: Davina Poke D.O.   On: 11/27/2020 14:22   DG Pelvis 1-2 Views  Result Date: 11/27/2020 CLINICAL DATA:  Fall.  Denies hip or pelvic pain. EXAM: PELVIS - 1-2 VIEW COMPARISON:  Pelvis x-ray dated August 27, 2020. FINDINGS: There is no evidence of pelvic fracture or diastasis. No pelvic bone lesions are seen. IMPRESSION: Negative. Electronically Signed   By: Titus Dubin M.D.   On: 11/27/2020 16:44   CT Head Wo Contrast  Result Date: 11/27/2020 CLINICAL DATA:  Facial trauma. Neck trauma. Syncopal episode, hematoma over right eye. EXAM: CT HEAD WITHOUT CONTRAST CT MAXILLOFACIAL WITHOUT CONTRAST CT CERVICAL SPINE WITHOUT CONTRAST TECHNIQUE: Multidetector CT imaging of the head, cervical spine, and maxillofacial structures were performed using the standard protocol without intravenous contrast. Multiplanar CT image reconstructions of the cervical spine and maxillofacial structures were also generated. COMPARISON:  CT head/cervical spine/maxillofacial 08/27/2020. FINDINGS: CT HEAD FINDINGS Brain: Mild cerebral and cerebellar atrophy. Acute subdural hematoma overlying the anterior right frontal lobe/frontal operculum and right temporal lobe measuring up to 6 mm in thickness (for instance as seen on series 5, image 21). Minimal mass effect upon the underlying right cerebral hemisphere. Trace 1-2 mm leftward midline shift at the level of the septum pellucidum. Additional trace  acute subdural hemorrhage along the right aspect of the falx anteriorly. Ill-defined hypoattenuation within the cerebral white matter is nonspecific, but compatible with chronic small vessel ischemic disease. Chronic small vessel infarcts within the deep  frontal white matter bilaterally. No demarcated cortical infarct. No evidence of intracranial mass. Vascular: No hyperdense vessel.  Atherosclerotic calcifications. Skull: Normal. Negative for fracture or focal lesion. Other: Sizable right anterior scalp hematoma extending to the right periorbital region. CT MAXILLOFACIAL FINDINGS Osseous: No acute maxillofacial fracture is identified. Redemonstrated chronic fracture deformity of the maxillary alveolar ridge on the right. Orbits: Right periorbital hematoma. No other acute finding. The globes are normal in size and contour. The extraocular muscles and optic nerve sheath complexes are symmetric and unremarkable. Sinuses: Trace bilateral ethmoid and maxillary sinus mucosal thickening. Soft tissues: Sizable hematoma within the right anterior scalp and extending to the right periorbital region. CT CERVICAL SPINE FINDINGS Alignment: Trace C4-C5 grade 1 retrolisthesis. Skull base and vertebrae: The basion-dental and atlanto-dental intervals are maintained.No evidence of acute fracture to the cervical spine. Soft tissues and spinal canal: No prevertebral fluid or swelling. No visible canal hematoma. Disc levels: Cervical spondylosis with multilevel disc space narrowing, disc bulges and uncovertebral hypertrophy. Disc space narrowing is severe at C3-C4. Upper chest: No consolidation within the imaged lung apices. No visible pneumothorax. These results were called by telephone at the time of interpretation on 11/27/2020 at 3:38 pm to provider Dr. Alvino Chapel, who verbally acknowledged these results. IMPRESSION: CT head: 1. Acute subdural hematoma overlying the anterior right frontal lobe/frontal operculum and right temporal lobe, measuring 6 mm in thickness. Minimal mass effect upon the underlying right cerebral hemisphere. Trace 1-2 mm leftward midline shift. 2. Trace acute subdural hemorrhage along the right aspect of the anterior falx. 3. Sizable right anterior scalp  hematoma extending to the right periorbital region. 4. Generalized atrophy of the brain and chronic small vessel ischemic disease. CT maxillofacial: 1. No evidence of acute maxillofacial fracture. 2. Sizable right anterior scalp hematoma extending to the right periorbital region. CT cervical spine: 1. No evidence of acute fracture to the cervical spine. 2. Unchanged mild C4-C5 grade 1 retrolisthesis. 3. Cervical spondylosis as described. Electronically Signed   By: Kellie Simmering DO   On: 11/27/2020 15:40   CT Chest W Contrast  Result Date: 11/27/2020 CLINICAL DATA:  Found down, fell, posterior right chest pain EXAM: CT CHEST WITH CONTRAST TECHNIQUE: Multidetector CT imaging of the chest was performed during intravenous contrast administration. CONTRAST:  37mL OMNIPAQUE IOHEXOL 300 MG/ML  SOLN COMPARISON:  11/27/2020 FINDINGS: Cardiovascular: The heart and great vessels are unremarkable without pericardial effusion. Moderate coronary artery atherosclerosis. No evidence of vascular injury. Normal caliber of the thoracic aorta, with diffuse atherosclerosis. Mediastinum/Nodes: No enlarged mediastinal, hilar, or axillary lymph nodes. Thyroid gland, trachea, and esophagus demonstrate no significant findings. Lungs/Pleura: No acute airspace disease, effusion, or pneumothorax. Dependent hypoventilatory changes are seen within the lower lobes. Multiple nodules are seen within the right upper lobe abutting the minor fissure, largest measuring 15 x 7 x 12 mm. No other pulmonary nodules or masses. The central airways are patent. Upper Abdomen: Bilateral renal cortical thinning. Indeterminate renal hypodensities likely reflects cysts. No acute upper abdominal findings. Musculoskeletal: There are nondisplaced right anterior fourth and fifth rib fractures corresponding to chest x-ray findings. No other acute bony abnormalities. Reconstructed images are unremarkable. IMPRESSION: 1. Nondisplaced right anterior fourth and fifth  rib fractures. 2. Multiple contiguous right upper lobe pulmonary nodules, largest measuring 11 mm  in mean diameter. Non-contrast chest CT at 3-6 months is recommended. If the nodules are stable at time of repeat CT, then future CT at 18-24 months (from today's scan) is considered optional for low-risk patients, but is recommended for high-risk patients. This recommendation follows the consensus statement: Guidelines for Management of Incidental Pulmonary Nodules Detected on CT Images: From the Fleischner Society 2017; Radiology 2017; 284:228-243. 3. Aortic Atherosclerosis (ICD10-I70.0). Coronary artery atherosclerosis. Electronically Signed   By: Randa Ngo M.D.   On: 11/27/2020 16:31   CT Cervical Spine Wo Contrast  Result Date: 11/27/2020 CLINICAL DATA:  Facial trauma. Neck trauma. Syncopal episode, hematoma over right eye. EXAM: CT HEAD WITHOUT CONTRAST CT MAXILLOFACIAL WITHOUT CONTRAST CT CERVICAL SPINE WITHOUT CONTRAST TECHNIQUE: Multidetector CT imaging of the head, cervical spine, and maxillofacial structures were performed using the standard protocol without intravenous contrast. Multiplanar CT image reconstructions of the cervical spine and maxillofacial structures were also generated. COMPARISON:  CT head/cervical spine/maxillofacial 08/27/2020. FINDINGS: CT HEAD FINDINGS Brain: Mild cerebral and cerebellar atrophy. Acute subdural hematoma overlying the anterior right frontal lobe/frontal operculum and right temporal lobe measuring up to 6 mm in thickness (for instance as seen on series 5, image 21). Minimal mass effect upon the underlying right cerebral hemisphere. Trace 1-2 mm leftward midline shift at the level of the septum pellucidum. Additional trace acute subdural hemorrhage along the right aspect of the falx anteriorly. Ill-defined hypoattenuation within the cerebral white matter is nonspecific, but compatible with chronic small vessel ischemic disease. Chronic small vessel infarcts within  the deep frontal white matter bilaterally. No demarcated cortical infarct. No evidence of intracranial mass. Vascular: No hyperdense vessel.  Atherosclerotic calcifications. Skull: Normal. Negative for fracture or focal lesion. Other: Sizable right anterior scalp hematoma extending to the right periorbital region. CT MAXILLOFACIAL FINDINGS Osseous: No acute maxillofacial fracture is identified. Redemonstrated chronic fracture deformity of the maxillary alveolar ridge on the right. Orbits: Right periorbital hematoma. No other acute finding. The globes are normal in size and contour. The extraocular muscles and optic nerve sheath complexes are symmetric and unremarkable. Sinuses: Trace bilateral ethmoid and maxillary sinus mucosal thickening. Soft tissues: Sizable hematoma within the right anterior scalp and extending to the right periorbital region. CT CERVICAL SPINE FINDINGS Alignment: Trace C4-C5 grade 1 retrolisthesis. Skull base and vertebrae: The basion-dental and atlanto-dental intervals are maintained.No evidence of acute fracture to the cervical spine. Soft tissues and spinal canal: No prevertebral fluid or swelling. No visible canal hematoma. Disc levels: Cervical spondylosis with multilevel disc space narrowing, disc bulges and uncovertebral hypertrophy. Disc space narrowing is severe at C3-C4. Upper chest: No consolidation within the imaged lung apices. No visible pneumothorax. These results were called by telephone at the time of interpretation on 11/27/2020 at 3:38 pm to provider Dr. Alvino Chapel, who verbally acknowledged these results. IMPRESSION: CT head: 1. Acute subdural hematoma overlying the anterior right frontal lobe/frontal operculum and right temporal lobe, measuring 6 mm in thickness. Minimal mass effect upon the underlying right cerebral hemisphere. Trace 1-2 mm leftward midline shift. 2. Trace acute subdural hemorrhage along the right aspect of the anterior falx. 3. Sizable right anterior  scalp hematoma extending to the right periorbital region. 4. Generalized atrophy of the brain and chronic small vessel ischemic disease. CT maxillofacial: 1. No evidence of acute maxillofacial fracture. 2. Sizable right anterior scalp hematoma extending to the right periorbital region. CT cervical spine: 1. No evidence of acute fracture to the cervical spine. 2. Unchanged mild C4-C5 grade 1 retrolisthesis.  3. Cervical spondylosis as described. Electronically Signed   By: Kellie Simmering DO   On: 11/27/2020 15:40   CT Maxillofacial WO CM  Result Date: 11/27/2020 CLINICAL DATA:  Facial trauma. Neck trauma. Syncopal episode, hematoma over right eye. EXAM: CT HEAD WITHOUT CONTRAST CT MAXILLOFACIAL WITHOUT CONTRAST CT CERVICAL SPINE WITHOUT CONTRAST TECHNIQUE: Multidetector CT imaging of the head, cervical spine, and maxillofacial structures were performed using the standard protocol without intravenous contrast. Multiplanar CT image reconstructions of the cervical spine and maxillofacial structures were also generated. COMPARISON:  CT head/cervical spine/maxillofacial 08/27/2020. FINDINGS: CT HEAD FINDINGS Brain: Mild cerebral and cerebellar atrophy. Acute subdural hematoma overlying the anterior right frontal lobe/frontal operculum and right temporal lobe measuring up to 6 mm in thickness (for instance as seen on series 5, image 21). Minimal mass effect upon the underlying right cerebral hemisphere. Trace 1-2 mm leftward midline shift at the level of the septum pellucidum. Additional trace acute subdural hemorrhage along the right aspect of the falx anteriorly. Ill-defined hypoattenuation within the cerebral white matter is nonspecific, but compatible with chronic small vessel ischemic disease. Chronic small vessel infarcts within the deep frontal white matter bilaterally. No demarcated cortical infarct. No evidence of intracranial mass. Vascular: No hyperdense vessel.  Atherosclerotic calcifications. Skull: Normal.  Negative for fracture or focal lesion. Other: Sizable right anterior scalp hematoma extending to the right periorbital region. CT MAXILLOFACIAL FINDINGS Osseous: No acute maxillofacial fracture is identified. Redemonstrated chronic fracture deformity of the maxillary alveolar ridge on the right. Orbits: Right periorbital hematoma. No other acute finding. The globes are normal in size and contour. The extraocular muscles and optic nerve sheath complexes are symmetric and unremarkable. Sinuses: Trace bilateral ethmoid and maxillary sinus mucosal thickening. Soft tissues: Sizable hematoma within the right anterior scalp and extending to the right periorbital region. CT CERVICAL SPINE FINDINGS Alignment: Trace C4-C5 grade 1 retrolisthesis. Skull base and vertebrae: The basion-dental and atlanto-dental intervals are maintained.No evidence of acute fracture to the cervical spine. Soft tissues and spinal canal: No prevertebral fluid or swelling. No visible canal hematoma. Disc levels: Cervical spondylosis with multilevel disc space narrowing, disc bulges and uncovertebral hypertrophy. Disc space narrowing is severe at C3-C4. Upper chest: No consolidation within the imaged lung apices. No visible pneumothorax. These results were called by telephone at the time of interpretation on 11/27/2020 at 3:38 pm to provider Dr. Alvino Chapel, who verbally acknowledged these results. IMPRESSION: CT head: 1. Acute subdural hematoma overlying the anterior right frontal lobe/frontal operculum and right temporal lobe, measuring 6 mm in thickness. Minimal mass effect upon the underlying right cerebral hemisphere. Trace 1-2 mm leftward midline shift. 2. Trace acute subdural hemorrhage along the right aspect of the anterior falx. 3. Sizable right anterior scalp hematoma extending to the right periorbital region. 4. Generalized atrophy of the brain and chronic small vessel ischemic disease. CT maxillofacial: 1. No evidence of acute maxillofacial  fracture. 2. Sizable right anterior scalp hematoma extending to the right periorbital region. CT cervical spine: 1. No evidence of acute fracture to the cervical spine. 2. Unchanged mild C4-C5 grade 1 retrolisthesis. 3. Cervical spondylosis as described. Electronically Signed   By: Kellie Simmering DO   On: 11/27/2020 15:40    Anti-infectives: HTN PMH dysrhythmia  Hx left reverse total shoulder replacement Pulmonary nodules - noted on CT, radiology rec repeat CT 3-6 months  Syncopal episode vs mechanical fall, amnestic to the event  Right frontal SDH - NS consult pending, TBI therapies  Right scalp hematoma -  local wound care, bacitracin R anterior Rib FX 4-5 -  no PTX appreciated, multimodal pain control, IS/pulm toilet, CXR in AM   FEN: per primary team/NS, ok for diet from trauma standpoint ID: no abx indicated at this time VTE: SCD's, chemical VTE held in the setting of acute intracranial bleed  Dispo: No pulmonary issues No noticeable pain from rib fractures  OK for discharge from trauma standpoint.  Would await final clearance from neurosurgery No trauma follow-up needed.  May follow-up with PCP Call us for any further questions.  LOS: 0 days    Maia Petties 11/28/2020

## 2020-11-28 NOTE — Progress Notes (Signed)
Occupational Therapy Evaluation Patient Details Name: Valerie Molina MRN: [id number] DOB: August 30, 1926 Today's Date: 11/28/2020    History of Present Illness This 86 y.o. female admitted after ground level fall in the elevator at [facility].  Pt with no recollection of the fall.  CT of her head showed an acute SDH in the Rt frontal lobe. She also sustained Rt anterior rib fx 4-5.  PMH includes:  h/o falls, Lt shoulder fx > s/p reverse TSA, HTN   Clinical Impression   Patient evaluated by Occupational Therapy with no further acute OT needs identified. All education has been completed and the patient has no further questions. Pt appears at or close to baseline and is able to perform ADLs mod I.  No obvious cognitive deficits noted, however, would recommend someone directly supervise her with medication management, and financial management to ensure no difficulty with these tasks.  Recommend she sit to shower, and move any items stored in low cabinets or drawers to be moved to within arms' reach due to the rib fractures - this was discussed with both pt and son.   See below for any follow-up Occupational Therapy or equipment needs. OT is signing off. Thank you for this referral.      Follow Up Recommendations  No OT follow up;Supervision - Intermittent (supervision initially with medication and financial management)    Equipment Recommendations  None recommended by OT    Recommendations for Other Services       Precautions / Restrictions Precautions Precautions: Fall Restrictions Weight Bearing Restrictions: No      Mobility Bed Mobility Overal bed mobility: Modified Independent                  Transfers Overall transfer level: Modified independent Equipment used: None             General transfer comment: pt with mild balance deficit, but can perform mod I    Balance Overall balance assessment: Mild deficits observed, not formally tested                                          ADL either performed or assessed with clinical judgement   ADL Overall ADL's : Modified independent                                       General ADL Comments: Pt able to perform mod I due to mild balance deficits. Recommend that she sit to shower, and recommended to she and son that they move items stored in low cabinets and drawers to within arms' reach to avoid bending due to rib fractures     Vision Baseline Vision/History: Wears glasses Wears Glasses: Reading only Patient Visual Report: No change from baseline Vision Assessment?: Yes Eye Alignment: Within Functional Limits Alignment/Gaze Preference: Within Defined Limits Tracking/Visual Pursuits: Able to track stimulus in all quads without difficulty Convergence: Within functional limits     Perception Perception Perception Tested?: Yes   Praxis Praxis Praxis tested?: Within functional limits    Pertinent Vitals/Pain Pain Assessment: Faces Faces Pain Scale: Hurts a little bit Pain Location: Rt ribs Pain Descriptors / Indicators: Aching;Grimacing;Guarding Pain Intervention(s): Monitored during session     Hand Dominance     Extremity/Trunk Assessment Upper Extremity Assessment Upper Extremity Assessment: LUE deficits/detail  LUE Deficits / Details: h/o Lt reverse TSA.  Otherwise WFL   Lower Extremity Assessment Lower Extremity Assessment: Defer to PT evaluation   Cervical / Trunk Assessment Cervical / Trunk Assessment: Kyphotic   Communication Communication Communication: No difficulties   Cognition Arousal/Alertness: Awake/alert Behavior During Therapy: WFL for tasks assessed/performed Overall Cognitive Status: Within Functional Limits for tasks assessed                                 General Comments: No obvious cognitive deficits.  Pt scored 2/28 on the Short Blessed Test.  She had one error with recall.  Discussed recommendation to monitor her  initially with financial and medication management with both pt and son.   General Comments  BP supine 128/60, HR 69; sitting 147/76, HR 70; standing 124/70, HR 71    Exercises     Shoulder Instructions      Home Living Family/patient expects to be discharged to:: Private residence Living Arrangements: Alone Available Help at Discharge: Family Type of Home: Independent living facility Home Access: Elevator     Home Layout: One level     Bathroom Shower/Tub: Hospital doctor Toilet: Handicapped height     Home Equipment: Grab bars - toilet;Grab bars - tub/shower;Cane - single point;Shower seat   Additional Comments: Lives at [facility]      Prior Functioning/Environment Level of Independence: Independent                 OT Problem List: Impaired balance (sitting and/or standing)      OT Treatment/Interventions:      OT Goals(Current goals can be found in the care plan section) Acute Rehab OT Goals Patient Stated Goal: to go back to [facility] today OT Goal Formulation: All assessment and education complete, DC therapy  OT Frequency:     Barriers to D/C:            Co-evaluation PT/OT/SLP Co-Evaluation/Treatment: Yes Reason for Co-Treatment: For patient/therapist safety   OT goals addressed during session: ADL's and self-care      AM-PAC OT "6 Clicks" Daily Activity     Outcome Measure Help from another person eating meals?: None Help from another person taking care of personal grooming?: None Help from another person toileting, which includes using toliet, bedpan, or urinal?: None Help from another person bathing (including washing, rinsing, drying)?: None Help from another person to put on and taking off regular upper body clothing?: None Help from another person to put on and taking off regular lower body clothing?: None 6 Click Score: 24   End of Session Equipment Utilized During Treatment: Gait belt Nurse Communication: Mobility  status  Activity Tolerance: Patient tolerated treatment well Patient left: in bed;with call bell/phone within reach;with family/visitor present  OT Visit Diagnosis: Unsteadiness on feet (R26.81)                Time: 7106-2694 OT Time Calculation (min): 25 min Charges:  OT General Charges $OT Visit: 1 Visit OT Evaluation $OT Eval Low Complexity: 1 Low  Nilsa Nutting., OTR/L Acute Rehabilitation Services Pager [phone] Office [phone]   Lucille Passy M 11/28/2020, 10:47 AM

## 2020-12-02 DIAGNOSIS — Z20828 Contact with and (suspected) exposure to other viral communicable diseases: Secondary | ICD-10-CM | POA: Diagnosis not present

## 2020-12-02 DIAGNOSIS — Z1159 Encounter for screening for other viral diseases: Secondary | ICD-10-CM | POA: Diagnosis not present

## 2020-12-04 DIAGNOSIS — R2689 Other abnormalities of gait and mobility: Secondary | ICD-10-CM | POA: Diagnosis not present

## 2020-12-04 DIAGNOSIS — S01111A Laceration without foreign body of right eyelid and periocular area, initial encounter: Secondary | ICD-10-CM | POA: Diagnosis not present

## 2020-12-04 DIAGNOSIS — S2241XA Multiple fractures of ribs, right side, initial encounter for closed fracture: Secondary | ICD-10-CM | POA: Diagnosis not present

## 2020-12-04 DIAGNOSIS — Z4802 Encounter for removal of sutures: Secondary | ICD-10-CM | POA: Diagnosis not present

## 2020-12-04 DIAGNOSIS — S060X1A Concussion with loss of consciousness of 30 minutes or less, initial encounter: Secondary | ICD-10-CM | POA: Diagnosis not present

## 2020-12-04 DIAGNOSIS — R296 Repeated falls: Secondary | ICD-10-CM | POA: Diagnosis not present

## 2020-12-04 DIAGNOSIS — N1832 Chronic kidney disease, stage 3b: Secondary | ICD-10-CM | POA: Diagnosis not present

## 2020-12-04 DIAGNOSIS — I129 Hypertensive chronic kidney disease with stage 1 through stage 4 chronic kidney disease, or unspecified chronic kidney disease: Secondary | ICD-10-CM | POA: Diagnosis not present

## 2020-12-04 DIAGNOSIS — R918 Other nonspecific abnormal finding of lung field: Secondary | ICD-10-CM | POA: Diagnosis not present

## 2020-12-04 DIAGNOSIS — R2681 Unsteadiness on feet: Secondary | ICD-10-CM | POA: Diagnosis not present

## 2020-12-09 DIAGNOSIS — R296 Repeated falls: Secondary | ICD-10-CM | POA: Diagnosis not present

## 2020-12-09 DIAGNOSIS — R2681 Unsteadiness on feet: Secondary | ICD-10-CM | POA: Diagnosis not present

## 2020-12-09 DIAGNOSIS — Z20828 Contact with and (suspected) exposure to other viral communicable diseases: Secondary | ICD-10-CM | POA: Diagnosis not present

## 2020-12-09 DIAGNOSIS — Z1159 Encounter for screening for other viral diseases: Secondary | ICD-10-CM | POA: Diagnosis not present

## 2020-12-09 DIAGNOSIS — R2689 Other abnormalities of gait and mobility: Secondary | ICD-10-CM | POA: Diagnosis not present

## 2020-12-10 ENCOUNTER — Other Ambulatory Visit: Payer: Self-pay | Admitting: Adult Health

## 2020-12-10 DIAGNOSIS — R918 Other nonspecific abnormal finding of lung field: Secondary | ICD-10-CM

## 2020-12-11 DIAGNOSIS — R296 Repeated falls: Secondary | ICD-10-CM | POA: Diagnosis not present

## 2020-12-11 DIAGNOSIS — R2689 Other abnormalities of gait and mobility: Secondary | ICD-10-CM | POA: Diagnosis not present

## 2020-12-11 DIAGNOSIS — R2681 Unsteadiness on feet: Secondary | ICD-10-CM | POA: Diagnosis not present

## 2020-12-12 DIAGNOSIS — S065X9A Traumatic subdural hemorrhage with loss of consciousness of unspecified duration, initial encounter: Secondary | ICD-10-CM | POA: Diagnosis not present

## 2020-12-16 DIAGNOSIS — R2689 Other abnormalities of gait and mobility: Secondary | ICD-10-CM | POA: Diagnosis not present

## 2020-12-16 DIAGNOSIS — R296 Repeated falls: Secondary | ICD-10-CM | POA: Diagnosis not present

## 2020-12-16 DIAGNOSIS — Z1159 Encounter for screening for other viral diseases: Secondary | ICD-10-CM | POA: Diagnosis not present

## 2020-12-16 DIAGNOSIS — Z20828 Contact with and (suspected) exposure to other viral communicable diseases: Secondary | ICD-10-CM | POA: Diagnosis not present

## 2020-12-16 DIAGNOSIS — R2681 Unsteadiness on feet: Secondary | ICD-10-CM | POA: Diagnosis not present

## 2020-12-20 DIAGNOSIS — R2689 Other abnormalities of gait and mobility: Secondary | ICD-10-CM | POA: Diagnosis not present

## 2020-12-20 DIAGNOSIS — R2681 Unsteadiness on feet: Secondary | ICD-10-CM | POA: Diagnosis not present

## 2020-12-20 DIAGNOSIS — R296 Repeated falls: Secondary | ICD-10-CM | POA: Diagnosis not present

## 2020-12-23 DIAGNOSIS — Z20828 Contact with and (suspected) exposure to other viral communicable diseases: Secondary | ICD-10-CM | POA: Diagnosis not present

## 2020-12-23 DIAGNOSIS — Z1159 Encounter for screening for other viral diseases: Secondary | ICD-10-CM | POA: Diagnosis not present

## 2020-12-26 DIAGNOSIS — R2689 Other abnormalities of gait and mobility: Secondary | ICD-10-CM | POA: Diagnosis not present

## 2020-12-26 DIAGNOSIS — R2681 Unsteadiness on feet: Secondary | ICD-10-CM | POA: Diagnosis not present

## 2020-12-26 DIAGNOSIS — R296 Repeated falls: Secondary | ICD-10-CM | POA: Diagnosis not present

## 2020-12-30 DIAGNOSIS — Z20828 Contact with and (suspected) exposure to other viral communicable diseases: Secondary | ICD-10-CM | POA: Diagnosis not present

## 2020-12-30 DIAGNOSIS — Z1159 Encounter for screening for other viral diseases: Secondary | ICD-10-CM | POA: Diagnosis not present

## 2021-01-01 DIAGNOSIS — R2689 Other abnormalities of gait and mobility: Secondary | ICD-10-CM | POA: Diagnosis not present

## 2021-01-01 DIAGNOSIS — R296 Repeated falls: Secondary | ICD-10-CM | POA: Diagnosis not present

## 2021-01-01 DIAGNOSIS — R2681 Unsteadiness on feet: Secondary | ICD-10-CM | POA: Diagnosis not present

## 2021-01-06 DIAGNOSIS — Z20828 Contact with and (suspected) exposure to other viral communicable diseases: Secondary | ICD-10-CM | POA: Diagnosis not present

## 2021-01-06 DIAGNOSIS — Z1159 Encounter for screening for other viral diseases: Secondary | ICD-10-CM | POA: Diagnosis not present

## 2021-01-13 DIAGNOSIS — Z1159 Encounter for screening for other viral diseases: Secondary | ICD-10-CM | POA: Diagnosis not present

## 2021-01-13 DIAGNOSIS — Z20828 Contact with and (suspected) exposure to other viral communicable diseases: Secondary | ICD-10-CM | POA: Diagnosis not present

## 2021-01-20 DIAGNOSIS — Z1159 Encounter for screening for other viral diseases: Secondary | ICD-10-CM | POA: Diagnosis not present

## 2021-01-20 DIAGNOSIS — Z20828 Contact with and (suspected) exposure to other viral communicable diseases: Secondary | ICD-10-CM | POA: Diagnosis not present

## 2021-01-27 DIAGNOSIS — Z1159 Encounter for screening for other viral diseases: Secondary | ICD-10-CM | POA: Diagnosis not present

## 2021-01-27 DIAGNOSIS — Z20828 Contact with and (suspected) exposure to other viral communicable diseases: Secondary | ICD-10-CM | POA: Diagnosis not present

## 2021-02-03 DIAGNOSIS — Z20828 Contact with and (suspected) exposure to other viral communicable diseases: Secondary | ICD-10-CM | POA: Diagnosis not present

## 2021-02-03 DIAGNOSIS — Z1159 Encounter for screening for other viral diseases: Secondary | ICD-10-CM | POA: Diagnosis not present

## 2021-02-10 DIAGNOSIS — H401131 Primary open-angle glaucoma, bilateral, mild stage: Secondary | ICD-10-CM | POA: Diagnosis not present

## 2021-02-10 DIAGNOSIS — Z961 Presence of intraocular lens: Secondary | ICD-10-CM | POA: Diagnosis not present

## 2021-02-10 DIAGNOSIS — H43812 Vitreous degeneration, left eye: Secondary | ICD-10-CM | POA: Diagnosis not present

## 2021-02-17 DIAGNOSIS — Z1159 Encounter for screening for other viral diseases: Secondary | ICD-10-CM | POA: Diagnosis not present

## 2021-02-17 DIAGNOSIS — Z20828 Contact with and (suspected) exposure to other viral communicable diseases: Secondary | ICD-10-CM | POA: Diagnosis not present

## 2021-02-24 DIAGNOSIS — Z1159 Encounter for screening for other viral diseases: Secondary | ICD-10-CM | POA: Diagnosis not present

## 2021-02-24 DIAGNOSIS — Z20828 Contact with and (suspected) exposure to other viral communicable diseases: Secondary | ICD-10-CM | POA: Diagnosis not present

## 2021-03-03 DIAGNOSIS — Z20828 Contact with and (suspected) exposure to other viral communicable diseases: Secondary | ICD-10-CM | POA: Diagnosis not present

## 2021-03-03 DIAGNOSIS — Z1159 Encounter for screening for other viral diseases: Secondary | ICD-10-CM | POA: Diagnosis not present

## 2021-03-10 DIAGNOSIS — Z20828 Contact with and (suspected) exposure to other viral communicable diseases: Secondary | ICD-10-CM | POA: Diagnosis not present

## 2021-03-10 DIAGNOSIS — Z1159 Encounter for screening for other viral diseases: Secondary | ICD-10-CM | POA: Diagnosis not present

## 2021-03-24 DIAGNOSIS — Z20828 Contact with and (suspected) exposure to other viral communicable diseases: Secondary | ICD-10-CM | POA: Diagnosis not present

## 2021-03-24 DIAGNOSIS — Z1159 Encounter for screening for other viral diseases: Secondary | ICD-10-CM | POA: Diagnosis not present

## 2021-03-26 DIAGNOSIS — E7800 Pure hypercholesterolemia, unspecified: Secondary | ICD-10-CM | POA: Diagnosis not present

## 2021-03-26 DIAGNOSIS — I129 Hypertensive chronic kidney disease with stage 1 through stage 4 chronic kidney disease, or unspecified chronic kidney disease: Secondary | ICD-10-CM | POA: Diagnosis not present

## 2021-03-26 DIAGNOSIS — N1832 Chronic kidney disease, stage 3b: Secondary | ICD-10-CM | POA: Diagnosis not present

## 2021-03-26 DIAGNOSIS — H259 Unspecified age-related cataract: Secondary | ICD-10-CM | POA: Diagnosis not present

## 2021-03-26 DIAGNOSIS — K5900 Constipation, unspecified: Secondary | ICD-10-CM | POA: Diagnosis not present

## 2021-03-26 DIAGNOSIS — H409 Unspecified glaucoma: Secondary | ICD-10-CM | POA: Diagnosis not present

## 2021-03-26 DIAGNOSIS — R2689 Other abnormalities of gait and mobility: Secondary | ICD-10-CM | POA: Diagnosis not present

## 2021-03-26 DIAGNOSIS — M8580 Other specified disorders of bone density and structure, unspecified site: Secondary | ICD-10-CM | POA: Diagnosis not present

## 2021-03-26 DIAGNOSIS — R809 Proteinuria, unspecified: Secondary | ICD-10-CM | POA: Diagnosis not present

## 2021-03-26 DIAGNOSIS — R918 Other nonspecific abnormal finding of lung field: Secondary | ICD-10-CM | POA: Diagnosis not present

## 2021-03-26 DIAGNOSIS — N183 Chronic kidney disease, stage 3 unspecified: Secondary | ICD-10-CM | POA: Diagnosis not present

## 2021-03-26 DIAGNOSIS — E039 Hypothyroidism, unspecified: Secondary | ICD-10-CM | POA: Diagnosis not present

## 2021-03-26 DIAGNOSIS — I8390 Asymptomatic varicose veins of unspecified lower extremity: Secondary | ICD-10-CM | POA: Diagnosis not present

## 2021-03-27 ENCOUNTER — Encounter: Payer: Self-pay | Admitting: Podiatry

## 2021-03-27 ENCOUNTER — Other Ambulatory Visit: Payer: Self-pay

## 2021-03-27 ENCOUNTER — Ambulatory Visit (INDEPENDENT_AMBULATORY_CARE_PROVIDER_SITE_OTHER): Payer: Medicare Other | Admitting: Podiatry

## 2021-03-27 DIAGNOSIS — L600 Ingrowing nail: Secondary | ICD-10-CM | POA: Diagnosis not present

## 2021-03-27 DIAGNOSIS — B351 Tinea unguium: Secondary | ICD-10-CM

## 2021-03-28 NOTE — Progress Notes (Signed)
Subjective:   Patient ID: Valerie Molina, female   DOB: 87 y.o.   MRN: [id number]   HPI Patient presents with caregiver stating that she has had a very discomforting right big toenail that is thick and they cannot cut and all her nails are elongated and get incurvated in the corners and are bothersome for her.  States that she tries to trim it and soak it and her caregiver tried to trim it without relief and patient has never smoked and is not active   Review of Systems  All other systems reviewed and are negative.       Objective:  Physical Exam Vitals and nursing note reviewed.  Constitutional:      Appearance: She is well-developed.  Pulmonary:     Effort: Pulmonary effort is normal.  Musculoskeletal:        General: Normal range of motion.  Skin:    General: Skin is warm.  Neurological:     Mental Status: She is alert.     Vascular status found to be mildly diminished both PT DP pulses but good digital perfusion was noted diminished hair growth was noted range of motion subtalar midtarsal joint diminished and muscle strength diminished.  Patient is found to have significant thickness of the right hallux nail that is dystrophic painful loose and lightly red with no active drainage noted and has thick yellow brittle nailbeds of all adjacent nails 2 through 5 right 1 through 5 left.     Assessment:  Damaged right hallux nail ingrown component in the corner painful thickened with all nails being mycotic and tender     Plan:  H&P reviewed all conditions.  At this point for the right hallux I anesthetized 60 mg Xylocaine Marcaine mixture I remove the nail flushed the bed applied sterile dressing and will allow it to regrow as I do not want the risk of having to heal a permanent procedure.  I debrided nailbeds 2 through 5 right 1 through 5 left no echogenic bleeding reappoint routine care for nail debridement of painful nailbeds sign visit

## 2021-03-31 DIAGNOSIS — Z1159 Encounter for screening for other viral diseases: Secondary | ICD-10-CM | POA: Diagnosis not present

## 2021-03-31 DIAGNOSIS — Z20828 Contact with and (suspected) exposure to other viral communicable diseases: Secondary | ICD-10-CM | POA: Diagnosis not present

## 2021-04-07 DIAGNOSIS — Z20828 Contact with and (suspected) exposure to other viral communicable diseases: Secondary | ICD-10-CM | POA: Diagnosis not present

## 2021-04-07 DIAGNOSIS — Z1159 Encounter for screening for other viral diseases: Secondary | ICD-10-CM | POA: Diagnosis not present

## 2021-04-14 DIAGNOSIS — Z20828 Contact with and (suspected) exposure to other viral communicable diseases: Secondary | ICD-10-CM | POA: Diagnosis not present

## 2021-04-14 DIAGNOSIS — Z1159 Encounter for screening for other viral diseases: Secondary | ICD-10-CM | POA: Diagnosis not present

## 2021-04-21 DIAGNOSIS — Z1159 Encounter for screening for other viral diseases: Secondary | ICD-10-CM | POA: Diagnosis not present

## 2021-04-21 DIAGNOSIS — Z20828 Contact with and (suspected) exposure to other viral communicable diseases: Secondary | ICD-10-CM | POA: Diagnosis not present

## 2021-04-28 DIAGNOSIS — Z20828 Contact with and (suspected) exposure to other viral communicable diseases: Secondary | ICD-10-CM | POA: Diagnosis not present

## 2021-04-28 DIAGNOSIS — Z1159 Encounter for screening for other viral diseases: Secondary | ICD-10-CM | POA: Diagnosis not present

## 2021-05-05 DIAGNOSIS — Z1159 Encounter for screening for other viral diseases: Secondary | ICD-10-CM | POA: Diagnosis not present

## 2021-05-05 DIAGNOSIS — Z20828 Contact with and (suspected) exposure to other viral communicable diseases: Secondary | ICD-10-CM | POA: Diagnosis not present

## 2021-05-12 DIAGNOSIS — Z20828 Contact with and (suspected) exposure to other viral communicable diseases: Secondary | ICD-10-CM | POA: Diagnosis not present

## 2021-05-12 DIAGNOSIS — Z1159 Encounter for screening for other viral diseases: Secondary | ICD-10-CM | POA: Diagnosis not present

## 2021-05-13 ENCOUNTER — Other Ambulatory Visit: Payer: Self-pay

## 2021-05-13 ENCOUNTER — Ambulatory Visit
Admission: RE | Admit: 2021-05-13 | Discharge: 2021-05-13 | Disposition: A | Discharge status: Home / Self Care | Payer: Medicare Other | Source: Ambulatory Visit | Attending: Adult Health | Admitting: Adult Health

## 2021-05-13 ENCOUNTER — Other Ambulatory Visit: Payer: Medicare Other

## 2021-05-13 DIAGNOSIS — R918 Other nonspecific abnormal finding of lung field: Secondary | ICD-10-CM | POA: Diagnosis not present

## 2021-05-19 DIAGNOSIS — Z1159 Encounter for screening for other viral diseases: Secondary | ICD-10-CM | POA: Diagnosis not present

## 2021-05-19 DIAGNOSIS — Z20828 Contact with and (suspected) exposure to other viral communicable diseases: Secondary | ICD-10-CM | POA: Diagnosis not present

## 2021-05-26 DIAGNOSIS — Z20828 Contact with and (suspected) exposure to other viral communicable diseases: Secondary | ICD-10-CM | POA: Diagnosis not present

## 2021-05-26 DIAGNOSIS — Z1159 Encounter for screening for other viral diseases: Secondary | ICD-10-CM | POA: Diagnosis not present

## 2021-06-02 DIAGNOSIS — Z1159 Encounter for screening for other viral diseases: Secondary | ICD-10-CM | POA: Diagnosis not present

## 2021-06-02 DIAGNOSIS — Z20828 Contact with and (suspected) exposure to other viral communicable diseases: Secondary | ICD-10-CM | POA: Diagnosis not present

## 2021-06-09 DIAGNOSIS — Z20828 Contact with and (suspected) exposure to other viral communicable diseases: Secondary | ICD-10-CM | POA: Diagnosis not present

## 2021-06-09 DIAGNOSIS — Z1159 Encounter for screening for other viral diseases: Secondary | ICD-10-CM | POA: Diagnosis not present

## 2021-06-16 DIAGNOSIS — Z20828 Contact with and (suspected) exposure to other viral communicable diseases: Secondary | ICD-10-CM | POA: Diagnosis not present

## 2021-06-16 DIAGNOSIS — Z1159 Encounter for screening for other viral diseases: Secondary | ICD-10-CM | POA: Diagnosis not present

## 2021-06-23 DIAGNOSIS — Z1159 Encounter for screening for other viral diseases: Secondary | ICD-10-CM | POA: Diagnosis not present

## 2021-06-23 DIAGNOSIS — Z20828 Contact with and (suspected) exposure to other viral communicable diseases: Secondary | ICD-10-CM | POA: Diagnosis not present

## 2021-06-30 DIAGNOSIS — Z1159 Encounter for screening for other viral diseases: Secondary | ICD-10-CM | POA: Diagnosis not present

## 2021-06-30 DIAGNOSIS — Z20828 Contact with and (suspected) exposure to other viral communicable diseases: Secondary | ICD-10-CM | POA: Diagnosis not present

## 2021-07-07 DIAGNOSIS — Z1159 Encounter for screening for other viral diseases: Secondary | ICD-10-CM | POA: Diagnosis not present

## 2021-07-07 DIAGNOSIS — Z20828 Contact with and (suspected) exposure to other viral communicable diseases: Secondary | ICD-10-CM | POA: Diagnosis not present

## 2021-07-14 DIAGNOSIS — Z20828 Contact with and (suspected) exposure to other viral communicable diseases: Secondary | ICD-10-CM | POA: Diagnosis not present

## 2021-07-21 DIAGNOSIS — Z20828 Contact with and (suspected) exposure to other viral communicable diseases: Secondary | ICD-10-CM | POA: Diagnosis not present

## 2021-07-24 IMAGING — CT CT HEAD W/O CM
3 series · 16 of 47 positions shown, 19 images · non-contrast
Comparison: None.

CLINICAL DATA: Status post trauma.

EXAM:
CT HEAD WITHOUT CONTRAST
TECHNIQUE: Contiguous axial images were obtained from the base of the skull
through the vertex without intravenous contrast.

[Series 3: head wo · axial · 0.44mm/px · z∈[-98,+32]mm · 10 of 32 slices shown, 13 images]
[im 3/32  brain]
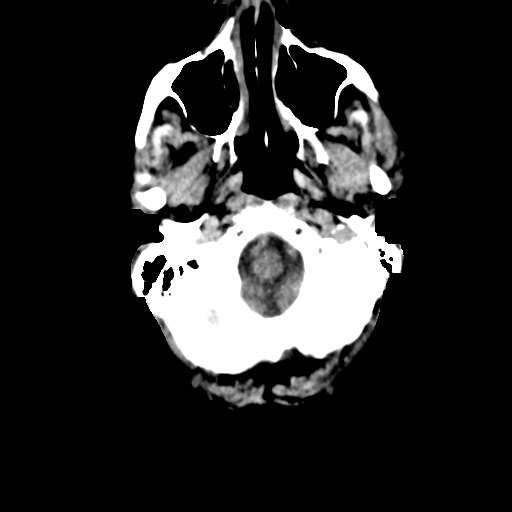
[im 3/32  bone]
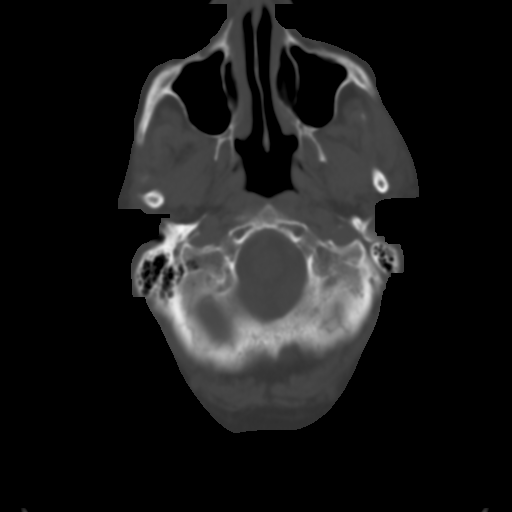
[im 6/32  brain]
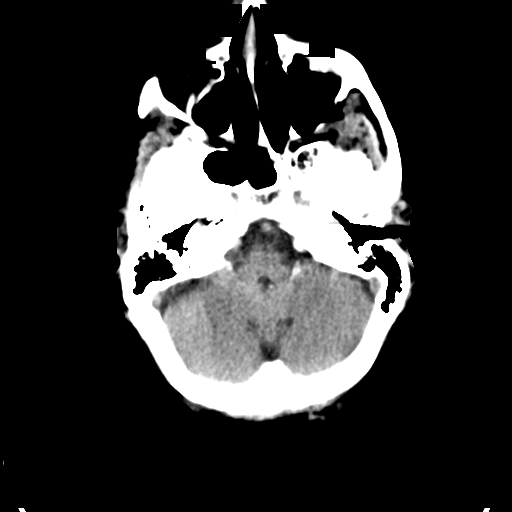
[im 9/32  brain]
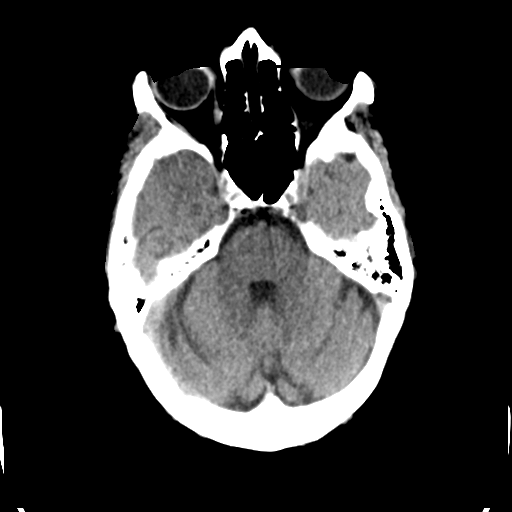
[im 11/32  brain]
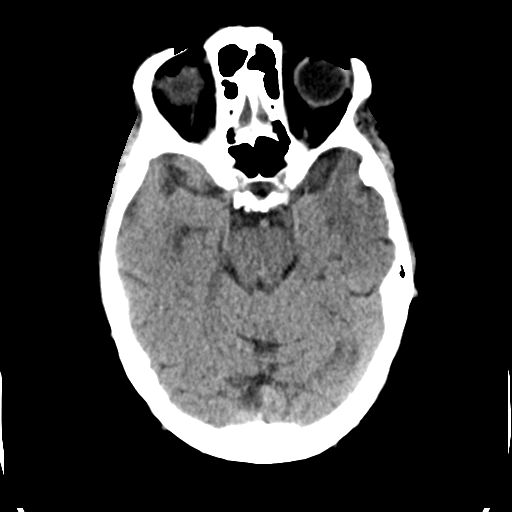
[im 14/32  brain]
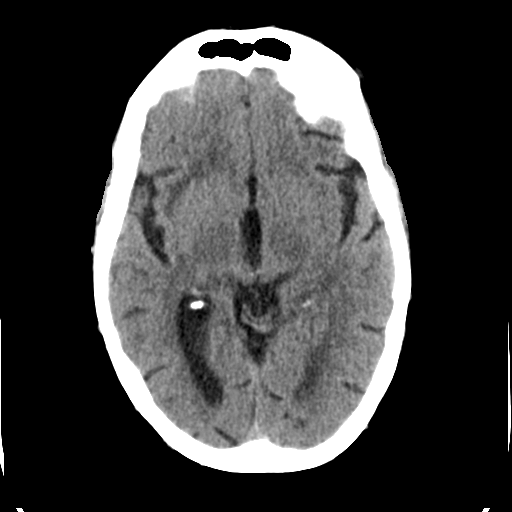
[im 14/32  bone]
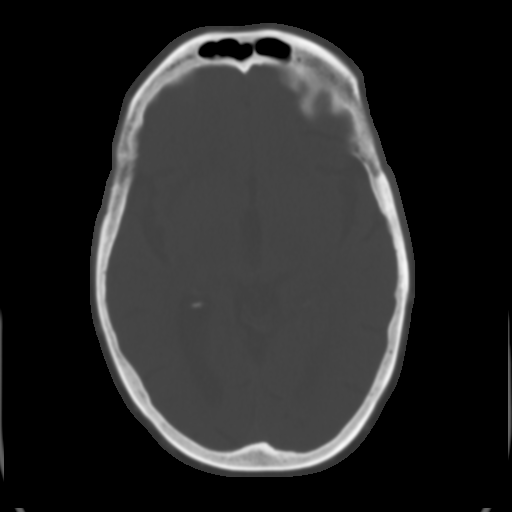
[im 18/32  brain]
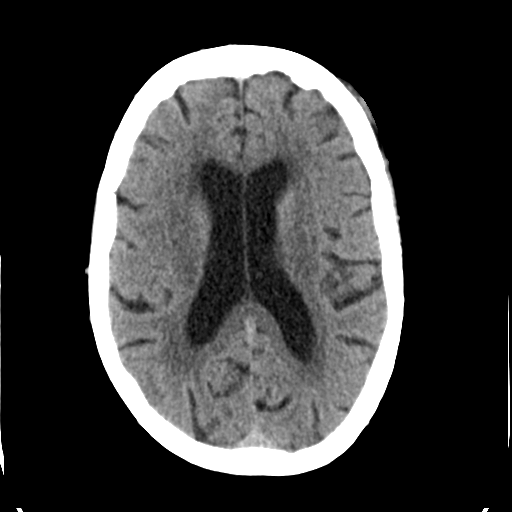
[im 21/32  brain]
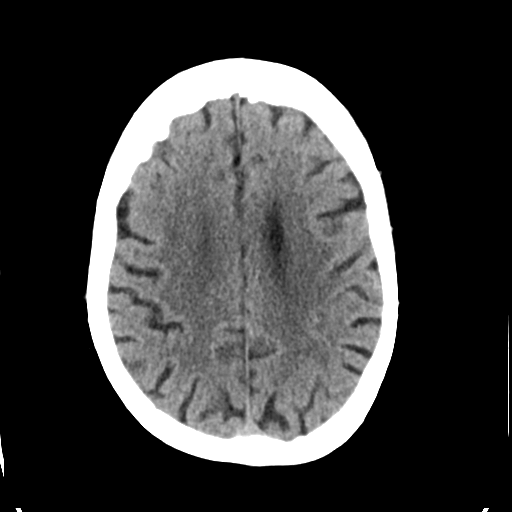
[im 24/32  brain]
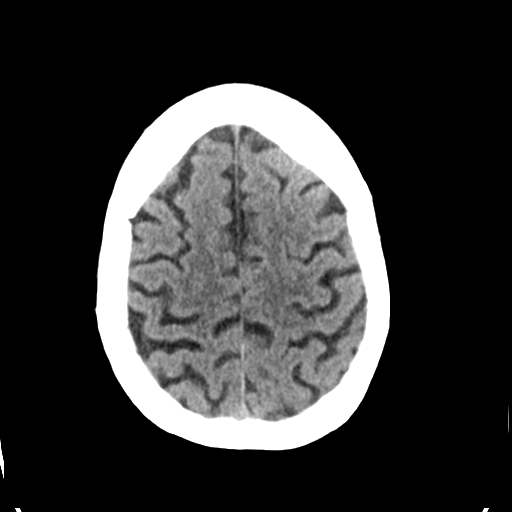
[im 26/32  brain]
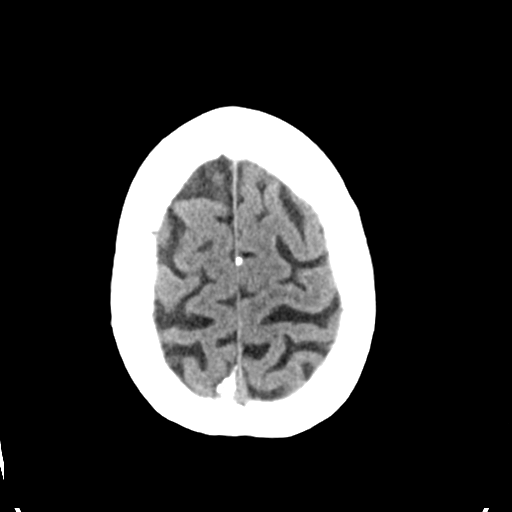
[im 26/32  bone]
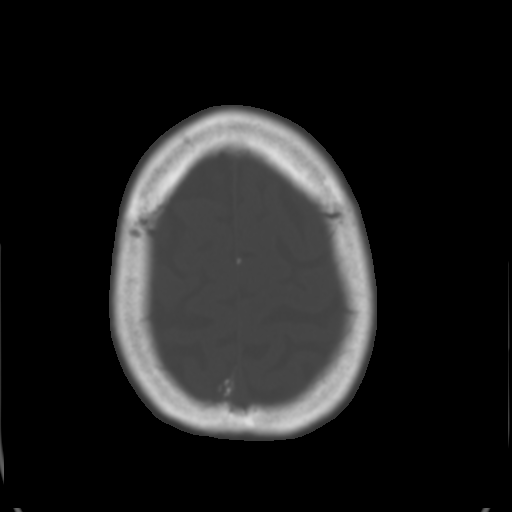
[im 29/32  brain]
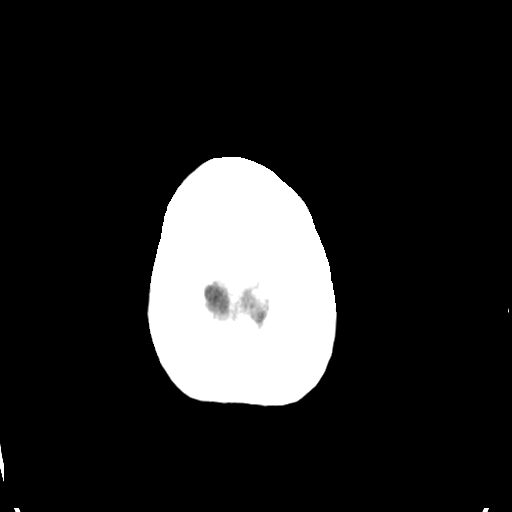

[Series 6: coronal soft tissue · coronal · 0.30mm/px · 3 of 68 slices shown]
[im 25/68  brain]
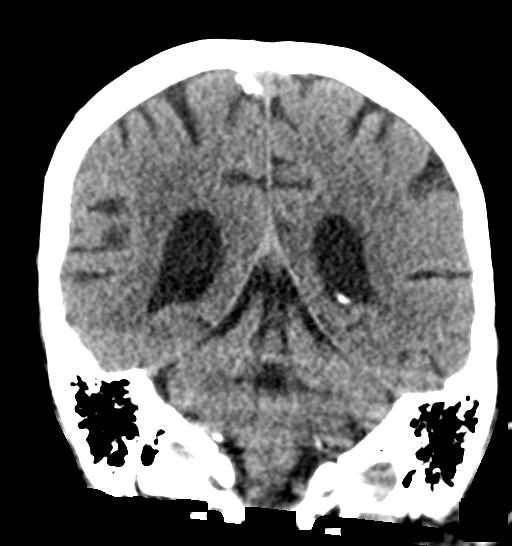
[im 31/68  brain]
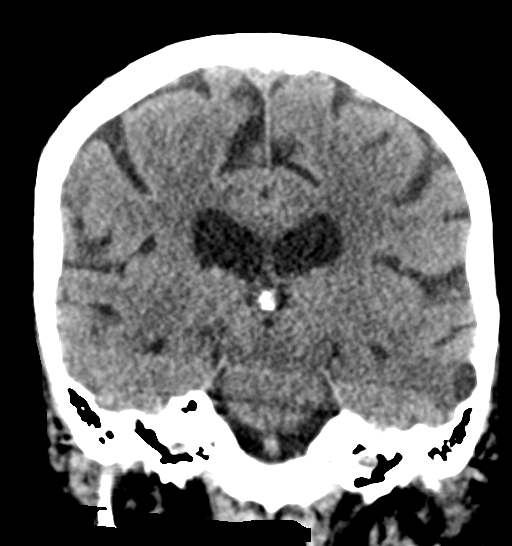
[im 37/68  brain]
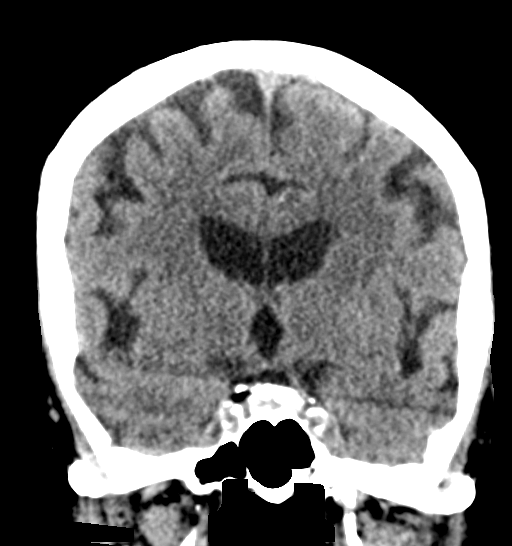

[Series 7: sagittal soft tissue · sagittal · 0.32mm/px · 3 of 51 slices shown]
[im 17/51  brain]
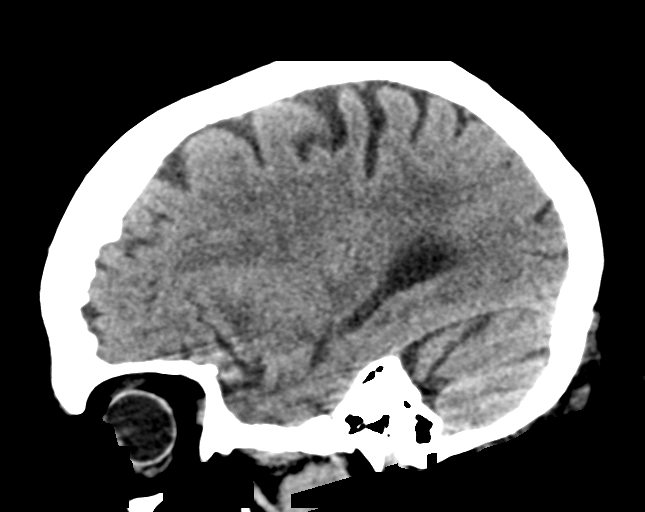
[im 26/51  brain]
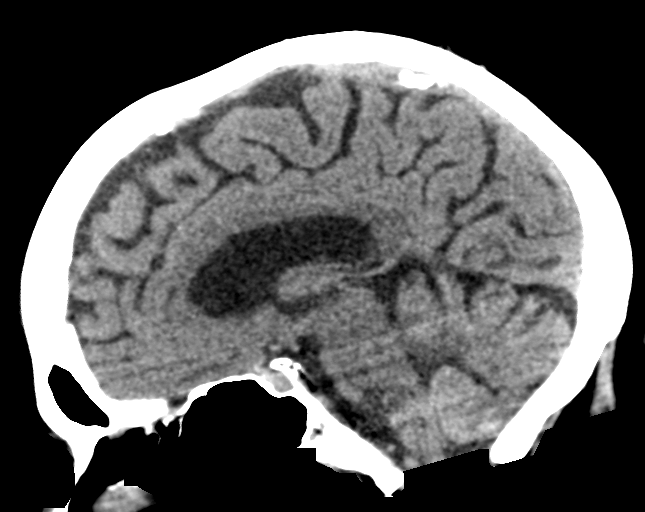
[im 34/51  brain]
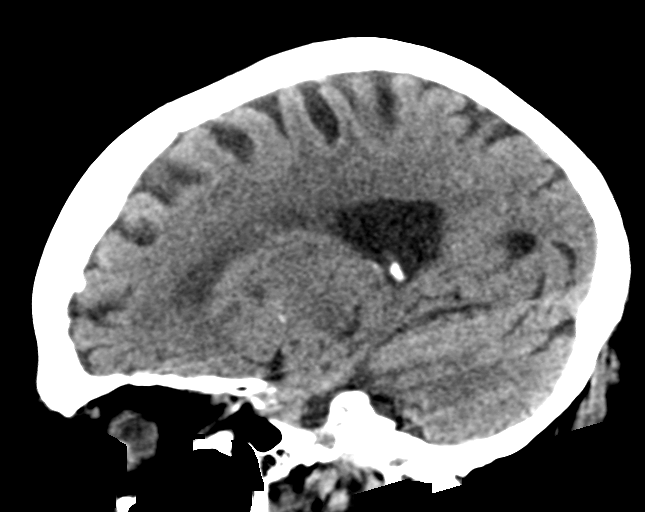

[16 of 47 positions shown; findings below may reference images not displayed]

FINDINGS: Brain: There is mild cerebral atrophy with widening of the
extra-axial spaces and ventricular dilatation.
There are areas of decreased attenuation within the white matter
tracts of the supratentorial brain, consistent with microvascular
disease changes.

Vascular: No hyperdense vessel or unexpected calcification.

Skull: Normal. Negative for fracture or focal lesion.

Sinuses/Orbits: No acute finding.

Other: There is mild left frontal scalp soft tissue swelling.
IMPRESSION: Mild left frontal scalp soft tissue swelling without evidence of an
acute fracture or acute intracranial abnormality.

## 2021-07-24 IMAGING — CT CT MAXILLOFACIAL W/O CM
3 series · 15 of 47 positions shown, 18 images · non-contrast
Comparison: None.

CLINICAL DATA: Status post trauma.

EXAM:
CT MAXILLOFACIAL WITHOUT CONTRAST
TECHNIQUE: Multidetector CT imaging of the maxillofacial structures was
performed. Multiplanar CT image reconstructions were also generated.

[Series 3: max soft · axial · 0.32mm/px · z∈[-192,-42]mm · 9 of 89 slices shown, 12 images]
[im 7/89  brain]
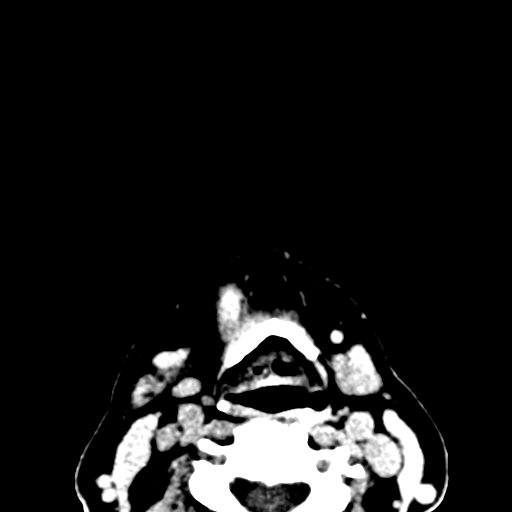
[im 7/89  bone]
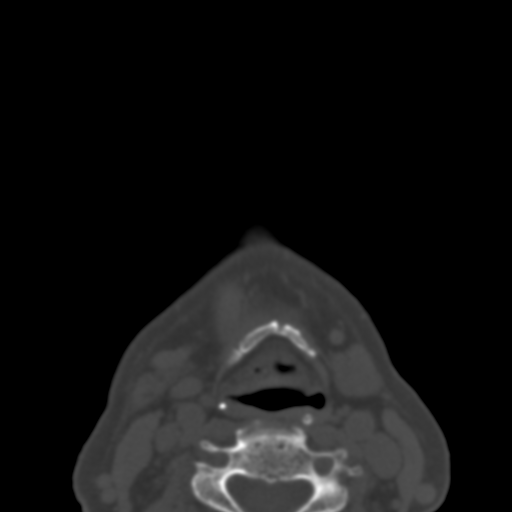
[im 16/89  bone]
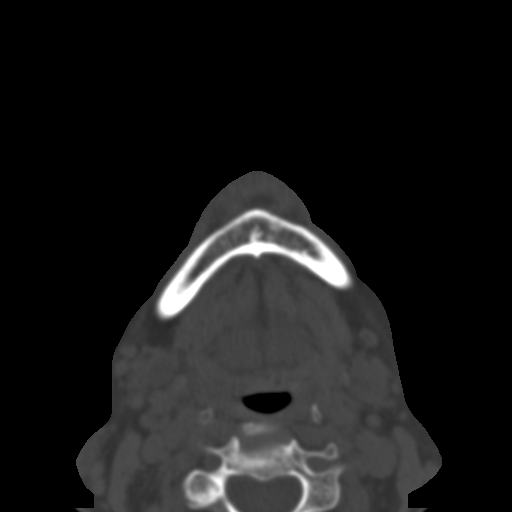
[im 25/89  bone]
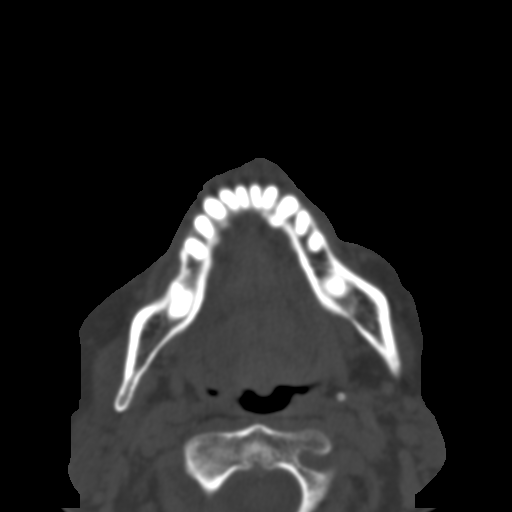
[im 34/89  bone]
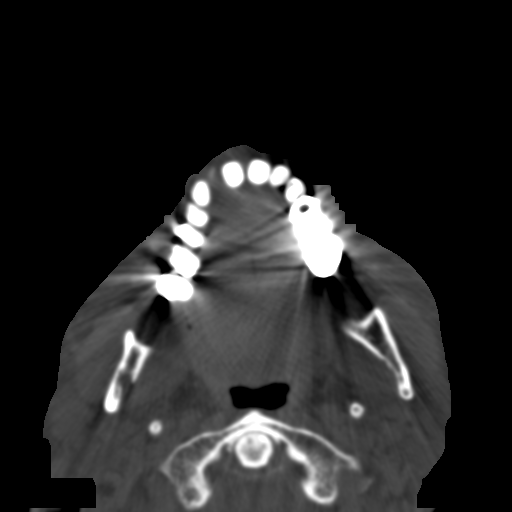
[im 46/89  brain]
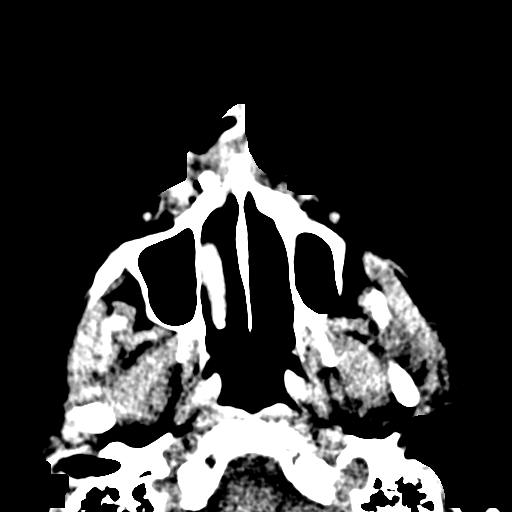
[im 46/89  bone]
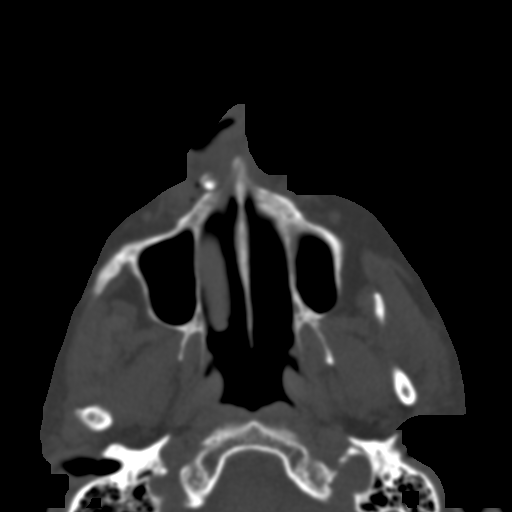
[im 55/89  bone]
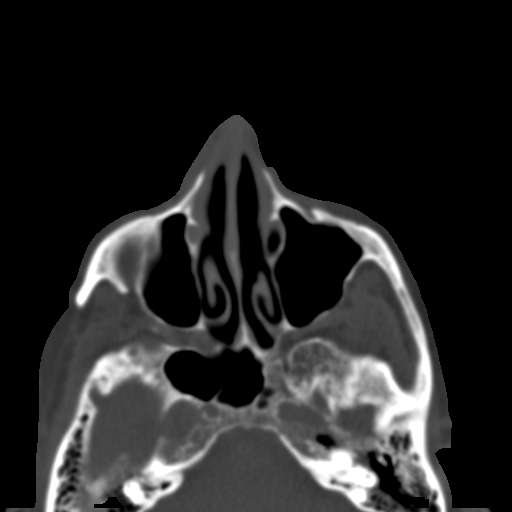
[im 64/89  bone]
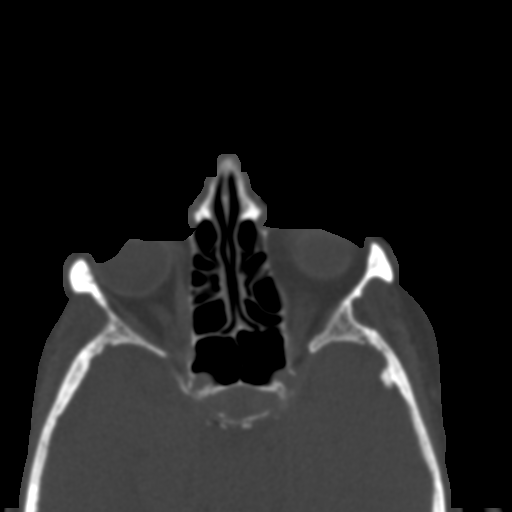
[im 73/89  bone]
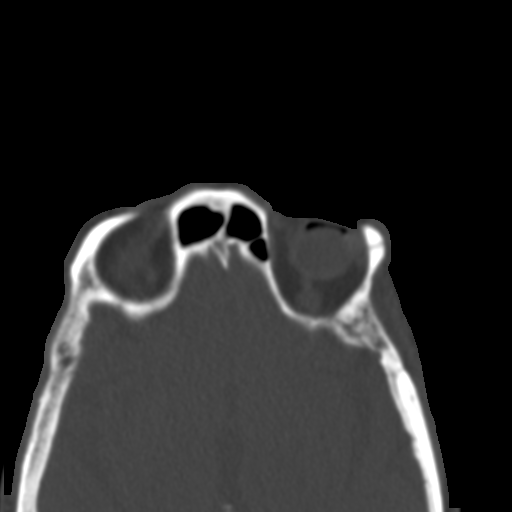
[im 82/89  brain]
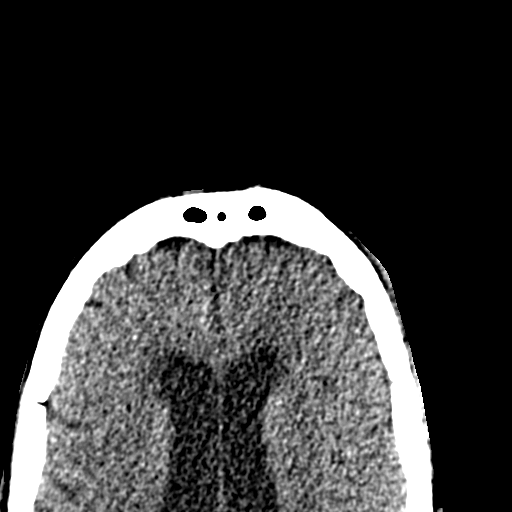
[im 82/89  bone]
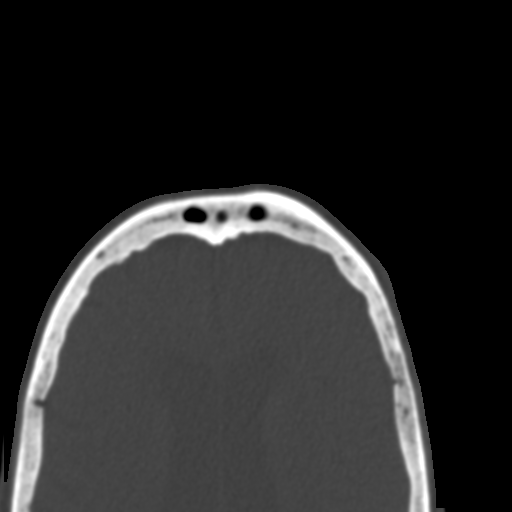

[Series 7: coronal soft · coronal · 0.32mm/px · 3 of 76 slices shown]
[im 26/76  bone]
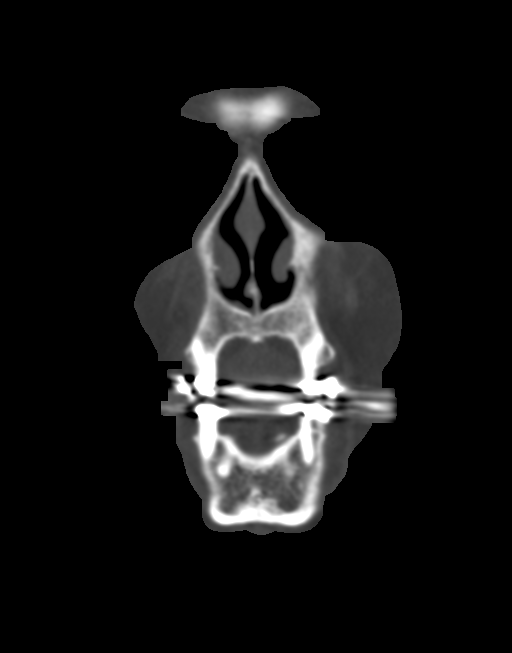
[im 34/76  bone]
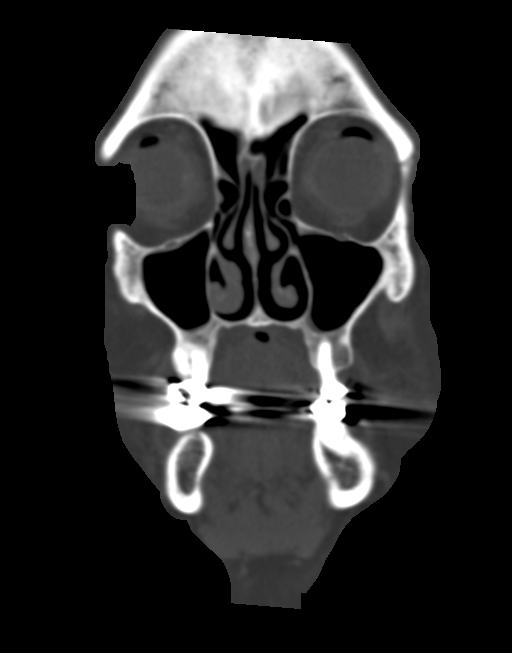
[im 42/76  bone]
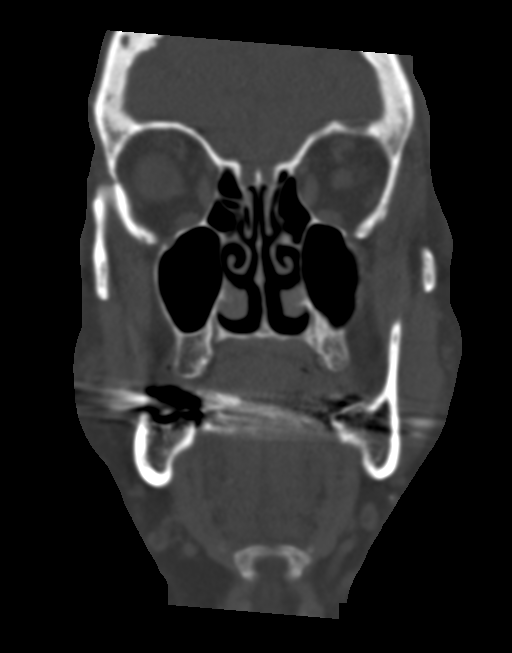

[Series 8: sagittal soft · sagittal · 0.29mm/px · 3 of 81 slices shown]
[im 27/81  bone]
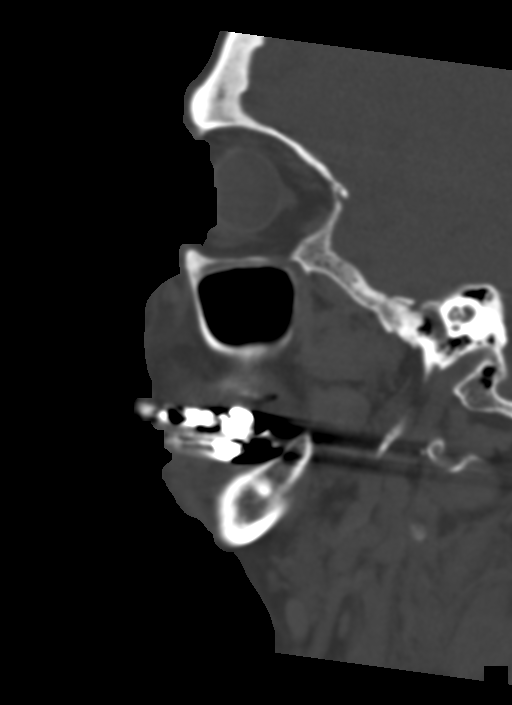
[im 41/81  bone]
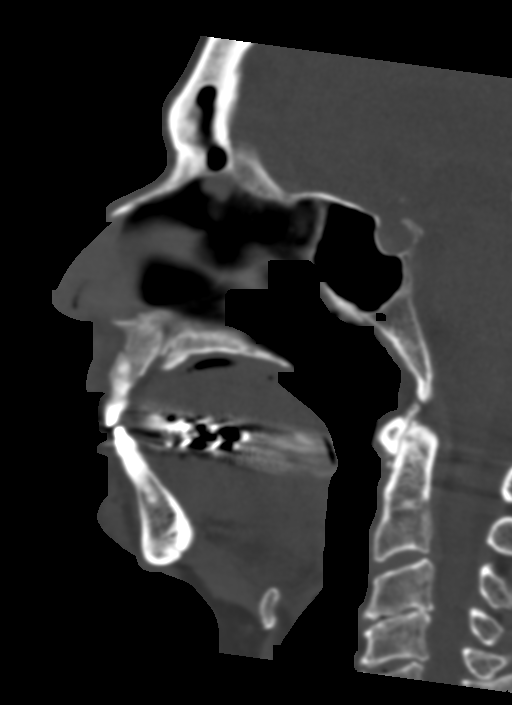
[im 54/81  bone]
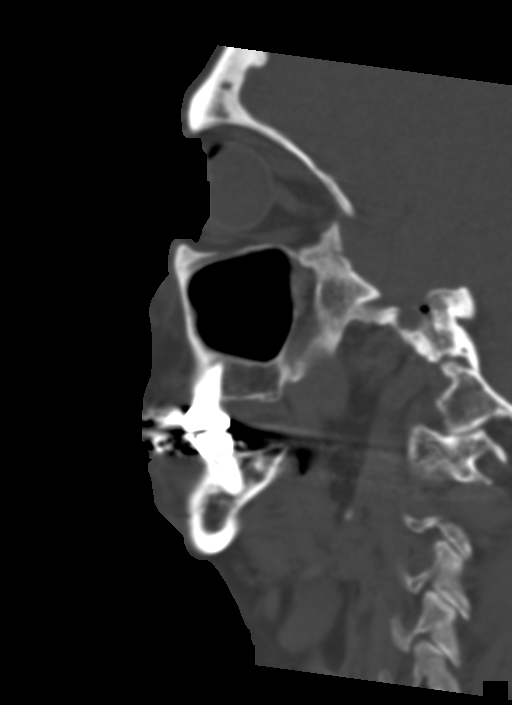

[15 of 47 positions shown; findings below may reference images not displayed]

FINDINGS: Osseous: There is displacement of a right lateral incisor with an
associated fracture of the alveolar process of the right maxillary
bone (axial CT images 47 through 51, CT series number 4).

Orbits: Negative. No traumatic or inflammatory finding.

Sinuses: Clear.

Soft tissues: There is mild left frontal scalp soft tissue swelling.
Mild anteromedial right facial soft tissue swelling is also seen

Limited intracranial: No significant or unexpected finding.
IMPRESSION: 1. Acute fracture of the alveolar process of the right maxillary
bone with associated displacement of the right lateral incisor.
2. Mild left frontal and right facial soft tissue swelling.

## 2021-07-28 DIAGNOSIS — Z20828 Contact with and (suspected) exposure to other viral communicable diseases: Secondary | ICD-10-CM | POA: Diagnosis not present

## 2021-08-04 DIAGNOSIS — Z20828 Contact with and (suspected) exposure to other viral communicable diseases: Secondary | ICD-10-CM | POA: Diagnosis not present

## 2021-08-11 DIAGNOSIS — Z20828 Contact with and (suspected) exposure to other viral communicable diseases: Secondary | ICD-10-CM | POA: Diagnosis not present

## 2021-08-13 DIAGNOSIS — Z961 Presence of intraocular lens: Secondary | ICD-10-CM | POA: Diagnosis not present

## 2021-08-13 DIAGNOSIS — H401131 Primary open-angle glaucoma, bilateral, mild stage: Secondary | ICD-10-CM | POA: Diagnosis not present

## 2021-08-13 DIAGNOSIS — H43812 Vitreous degeneration, left eye: Secondary | ICD-10-CM | POA: Diagnosis not present

## 2021-08-18 DIAGNOSIS — Z8616 Personal history of COVID-19: Secondary | ICD-10-CM | POA: Diagnosis not present

## 2021-08-25 DIAGNOSIS — Z8616 Personal history of COVID-19: Secondary | ICD-10-CM | POA: Diagnosis not present

## 2021-09-01 DIAGNOSIS — Z20828 Contact with and (suspected) exposure to other viral communicable diseases: Secondary | ICD-10-CM | POA: Diagnosis not present

## 2021-09-04 DIAGNOSIS — Z23 Encounter for immunization: Secondary | ICD-10-CM | POA: Diagnosis not present

## 2021-09-08 DIAGNOSIS — Z20828 Contact with and (suspected) exposure to other viral communicable diseases: Secondary | ICD-10-CM | POA: Diagnosis not present

## 2021-09-15 DIAGNOSIS — Z20828 Contact with and (suspected) exposure to other viral communicable diseases: Secondary | ICD-10-CM | POA: Diagnosis not present

## 2021-09-17 DIAGNOSIS — M859 Disorder of bone density and structure, unspecified: Secondary | ICD-10-CM | POA: Diagnosis not present

## 2021-09-17 DIAGNOSIS — E7800 Pure hypercholesterolemia, unspecified: Secondary | ICD-10-CM | POA: Diagnosis not present

## 2021-09-17 DIAGNOSIS — E039 Hypothyroidism, unspecified: Secondary | ICD-10-CM | POA: Diagnosis not present

## 2021-09-19 DIAGNOSIS — Z23 Encounter for immunization: Secondary | ICD-10-CM | POA: Diagnosis not present

## 2021-09-22 DIAGNOSIS — Z20828 Contact with and (suspected) exposure to other viral communicable diseases: Secondary | ICD-10-CM | POA: Diagnosis not present

## 2021-09-24 DIAGNOSIS — R809 Proteinuria, unspecified: Secondary | ICD-10-CM | POA: Diagnosis not present

## 2021-09-24 DIAGNOSIS — Z0000 Encounter for general adult medical examination without abnormal findings: Secondary | ICD-10-CM | POA: Diagnosis not present

## 2021-09-24 DIAGNOSIS — R2689 Other abnormalities of gait and mobility: Secondary | ICD-10-CM | POA: Diagnosis not present

## 2021-09-24 DIAGNOSIS — M1711 Unilateral primary osteoarthritis, right knee: Secondary | ICD-10-CM | POA: Diagnosis not present

## 2021-09-24 DIAGNOSIS — E7800 Pure hypercholesterolemia, unspecified: Secondary | ICD-10-CM | POA: Diagnosis not present

## 2021-09-24 DIAGNOSIS — N1832 Chronic kidney disease, stage 3b: Secondary | ICD-10-CM | POA: Diagnosis not present

## 2021-09-24 DIAGNOSIS — Z1331 Encounter for screening for depression: Secondary | ICD-10-CM | POA: Diagnosis not present

## 2021-09-24 DIAGNOSIS — R918 Other nonspecific abnormal finding of lung field: Secondary | ICD-10-CM | POA: Diagnosis not present

## 2021-09-24 DIAGNOSIS — I8390 Asymptomatic varicose veins of unspecified lower extremity: Secondary | ICD-10-CM | POA: Diagnosis not present

## 2021-09-24 DIAGNOSIS — E039 Hypothyroidism, unspecified: Secondary | ICD-10-CM | POA: Diagnosis not present

## 2021-09-24 DIAGNOSIS — Z1339 Encounter for screening examination for other mental health and behavioral disorders: Secondary | ICD-10-CM | POA: Diagnosis not present

## 2021-09-24 DIAGNOSIS — R82998 Other abnormal findings in urine: Secondary | ICD-10-CM | POA: Diagnosis not present

## 2021-09-24 DIAGNOSIS — I129 Hypertensive chronic kidney disease with stage 1 through stage 4 chronic kidney disease, or unspecified chronic kidney disease: Secondary | ICD-10-CM | POA: Diagnosis not present

## 2021-09-24 DIAGNOSIS — M8580 Other specified disorders of bone density and structure, unspecified site: Secondary | ICD-10-CM | POA: Diagnosis not present

## 2021-09-29 DIAGNOSIS — Z20828 Contact with and (suspected) exposure to other viral communicable diseases: Secondary | ICD-10-CM | POA: Diagnosis not present

## 2021-10-06 ENCOUNTER — Other Ambulatory Visit: Payer: Self-pay

## 2021-10-06 ENCOUNTER — Encounter: Payer: Self-pay | Admitting: Podiatry

## 2021-10-06 ENCOUNTER — Ambulatory Visit (INDEPENDENT_AMBULATORY_CARE_PROVIDER_SITE_OTHER): Payer: Medicare Other | Admitting: Podiatry

## 2021-10-06 DIAGNOSIS — L600 Ingrowing nail: Secondary | ICD-10-CM | POA: Diagnosis not present

## 2021-10-06 DIAGNOSIS — M79675 Pain in left toe(s): Secondary | ICD-10-CM

## 2021-10-06 DIAGNOSIS — Z8616 Personal history of COVID-19: Secondary | ICD-10-CM | POA: Diagnosis not present

## 2021-10-06 DIAGNOSIS — B351 Tinea unguium: Secondary | ICD-10-CM | POA: Diagnosis not present

## 2021-10-06 DIAGNOSIS — M79674 Pain in right toe(s): Secondary | ICD-10-CM | POA: Diagnosis not present

## 2021-10-06 NOTE — Progress Notes (Signed)
Subjective:   Patient ID: Valerie Molina, female   DOB: 87 y.o.   MRN: [id number]   HPI Patient presents stating she has a very painful right big toenail that did great for about 5 months and she has incurvated nailbeds of both feet that she cannot take care of and presents with caregiver   ROS      Objective:  Physical Exam  Neurovascular status intact with a very thickened dystrophic left right hallux nail that is painful and remaining nails that are incurvated gets sore and she cannot cut and are also discomforting     Assessment:  Damaged right hallux nail that has regrown but we cannot do permanently due to reduced circulatory status along with mycotic nail infection 1 through 5 both feet      Plan:  H&P reviewed both conditions.  For the right hallux I anesthetized 60 mg like Marcaine mixture using sterile instrumentation I remove the nail flush the bed applied sterile dressing instructed on soaks and applied dressing.  For the remaining nails I debrided 2 through 5 right 1 through 5 left no iatrogenic bleeding reappoint routine care and unfortunately probably every 6 months to a year we will need to take his right nail off

## 2021-10-13 DIAGNOSIS — Z20828 Contact with and (suspected) exposure to other viral communicable diseases: Secondary | ICD-10-CM | POA: Diagnosis not present

## 2021-10-20 DIAGNOSIS — Z20828 Contact with and (suspected) exposure to other viral communicable diseases: Secondary | ICD-10-CM | POA: Diagnosis not present

## 2021-10-27 DIAGNOSIS — Z20828 Contact with and (suspected) exposure to other viral communicable diseases: Secondary | ICD-10-CM | POA: Diagnosis not present

## 2021-11-03 DIAGNOSIS — Z1159 Encounter for screening for other viral diseases: Secondary | ICD-10-CM | POA: Diagnosis not present

## 2021-11-03 DIAGNOSIS — Z20828 Contact with and (suspected) exposure to other viral communicable diseases: Secondary | ICD-10-CM | POA: Diagnosis not present

## 2021-11-07 DIAGNOSIS — M1711 Unilateral primary osteoarthritis, right knee: Secondary | ICD-10-CM | POA: Diagnosis not present

## 2021-11-10 DIAGNOSIS — Z1159 Encounter for screening for other viral diseases: Secondary | ICD-10-CM | POA: Diagnosis not present

## 2021-11-10 DIAGNOSIS — Z20828 Contact with and (suspected) exposure to other viral communicable diseases: Secondary | ICD-10-CM | POA: Diagnosis not present

## 2021-11-17 DIAGNOSIS — Z1159 Encounter for screening for other viral diseases: Secondary | ICD-10-CM | POA: Diagnosis not present

## 2021-11-17 DIAGNOSIS — Z20828 Contact with and (suspected) exposure to other viral communicable diseases: Secondary | ICD-10-CM | POA: Diagnosis not present

## 2021-11-19 DIAGNOSIS — Z20828 Contact with and (suspected) exposure to other viral communicable diseases: Secondary | ICD-10-CM | POA: Diagnosis not present

## 2021-11-19 DIAGNOSIS — Z1159 Encounter for screening for other viral diseases: Secondary | ICD-10-CM | POA: Diagnosis not present

## 2021-11-21 DIAGNOSIS — Z20828 Contact with and (suspected) exposure to other viral communicable diseases: Secondary | ICD-10-CM | POA: Diagnosis not present

## 2021-11-21 DIAGNOSIS — Z1159 Encounter for screening for other viral diseases: Secondary | ICD-10-CM | POA: Diagnosis not present

## 2021-12-08 DIAGNOSIS — Z20822 Contact with and (suspected) exposure to covid-19: Secondary | ICD-10-CM | POA: Diagnosis not present

## 2021-12-15 DIAGNOSIS — Z961 Presence of intraocular lens: Secondary | ICD-10-CM | POA: Diagnosis not present

## 2021-12-15 DIAGNOSIS — H43812 Vitreous degeneration, left eye: Secondary | ICD-10-CM | POA: Diagnosis not present

## 2021-12-15 DIAGNOSIS — H401131 Primary open-angle glaucoma, bilateral, mild stage: Secondary | ICD-10-CM | POA: Diagnosis not present

## 2022-03-16 DIAGNOSIS — R600 Localized edema: Secondary | ICD-10-CM | POA: Diagnosis not present

## 2022-03-16 DIAGNOSIS — R5383 Other fatigue: Secondary | ICD-10-CM | POA: Diagnosis not present

## 2022-03-16 DIAGNOSIS — R2689 Other abnormalities of gait and mobility: Secondary | ICD-10-CM | POA: Diagnosis not present

## 2022-03-16 DIAGNOSIS — M533 Sacrococcygeal disorders, not elsewhere classified: Secondary | ICD-10-CM | POA: Diagnosis not present

## 2022-03-16 DIAGNOSIS — M1711 Unilateral primary osteoarthritis, right knee: Secondary | ICD-10-CM | POA: Diagnosis not present

## 2022-03-16 DIAGNOSIS — W1830XA Fall on same level, unspecified, initial encounter: Secondary | ICD-10-CM | POA: Diagnosis not present

## 2022-03-25 DIAGNOSIS — Z1339 Encounter for screening examination for other mental health and behavioral disorders: Secondary | ICD-10-CM | POA: Diagnosis not present

## 2022-03-25 DIAGNOSIS — R918 Other nonspecific abnormal finding of lung field: Secondary | ICD-10-CM | POA: Diagnosis not present

## 2022-03-25 DIAGNOSIS — E039 Hypothyroidism, unspecified: Secondary | ICD-10-CM | POA: Diagnosis not present

## 2022-03-25 DIAGNOSIS — M1711 Unilateral primary osteoarthritis, right knee: Secondary | ICD-10-CM | POA: Diagnosis not present

## 2022-03-25 DIAGNOSIS — I129 Hypertensive chronic kidney disease with stage 1 through stage 4 chronic kidney disease, or unspecified chronic kidney disease: Secondary | ICD-10-CM | POA: Diagnosis not present

## 2022-03-25 DIAGNOSIS — E7800 Pure hypercholesterolemia, unspecified: Secondary | ICD-10-CM | POA: Diagnosis not present

## 2022-03-25 DIAGNOSIS — R2689 Other abnormalities of gait and mobility: Secondary | ICD-10-CM | POA: Diagnosis not present

## 2022-03-25 DIAGNOSIS — N1832 Chronic kidney disease, stage 3b: Secondary | ICD-10-CM | POA: Diagnosis not present

## 2022-03-25 DIAGNOSIS — R809 Proteinuria, unspecified: Secondary | ICD-10-CM | POA: Diagnosis not present

## 2022-03-25 DIAGNOSIS — Z1331 Encounter for screening for depression: Secondary | ICD-10-CM | POA: Diagnosis not present

## 2022-04-01 DIAGNOSIS — M25561 Pain in right knee: Secondary | ICD-10-CM | POA: Diagnosis not present

## 2022-04-07 DIAGNOSIS — Z20822 Contact with and (suspected) exposure to covid-19: Secondary | ICD-10-CM | POA: Diagnosis not present

## 2022-04-16 ENCOUNTER — Ambulatory Visit (INDEPENDENT_AMBULATORY_CARE_PROVIDER_SITE_OTHER): Payer: Medicare Other | Admitting: Podiatry

## 2022-04-16 ENCOUNTER — Encounter: Payer: Self-pay | Admitting: Podiatry

## 2022-04-16 DIAGNOSIS — L600 Ingrowing nail: Secondary | ICD-10-CM | POA: Diagnosis not present

## 2022-04-16 DIAGNOSIS — M79674 Pain in right toe(s): Secondary | ICD-10-CM

## 2022-04-16 DIAGNOSIS — B351 Tinea unguium: Secondary | ICD-10-CM

## 2022-04-16 DIAGNOSIS — M79675 Pain in left toe(s): Secondary | ICD-10-CM

## 2022-04-17 NOTE — Progress Notes (Signed)
Subjective:  ? ?Patient ID: Valerie Molina, female   DOB: 88 y.o.   MRN: [id number]  ? ?HPI ?Patient presents with caregiver with a severely thickened dystrophic big toenail right that were not able to take off permanently is not concerned about circulation but does well for 6 months with removal ? ? ?ROS ? ? ?   ?Objective:  ?Physical Exam  ?Ocular status unchanged from previous visits with patient found to have a painful thickened right hallux nail that she cannot take care of herself ? ?   ?Assessment:  ?Chronic damaged hallux nail right that becomes very painful over about a 47-month ? ?   ?Plan:  ?Discussed with her and caregiver again that permanent procedure is somewhat risky for her and I would prefer to just remove it understanding to regrow.  They want this done and today I infiltrated the right hallux 60 mg like Marcaine mixture sterile prep to the nailbed done and using sterile instrumentation remove the hallux nail removed all nail tissue flushed the area applied sterile dressing instructed on soaks and reappoint as needed and will probably need this done again in 6 months ?   ? ? ?

## 2022-04-22 DIAGNOSIS — H401131 Primary open-angle glaucoma, bilateral, mild stage: Secondary | ICD-10-CM | POA: Diagnosis not present

## 2022-04-22 DIAGNOSIS — Z961 Presence of intraocular lens: Secondary | ICD-10-CM | POA: Diagnosis not present

## 2022-04-22 DIAGNOSIS — H43812 Vitreous degeneration, left eye: Secondary | ICD-10-CM | POA: Diagnosis not present

## 2022-09-03 DIAGNOSIS — Z23 Encounter for immunization: Secondary | ICD-10-CM | POA: Diagnosis not present

## 2022-09-08 DIAGNOSIS — Z23 Encounter for immunization: Secondary | ICD-10-CM | POA: Diagnosis not present

## 2022-10-07 DIAGNOSIS — R809 Proteinuria, unspecified: Secondary | ICD-10-CM | POA: Diagnosis not present

## 2022-10-07 DIAGNOSIS — R918 Other nonspecific abnormal finding of lung field: Secondary | ICD-10-CM | POA: Diagnosis not present

## 2022-10-07 DIAGNOSIS — N1832 Chronic kidney disease, stage 3b: Secondary | ICD-10-CM | POA: Diagnosis not present

## 2022-10-07 DIAGNOSIS — R82998 Other abnormal findings in urine: Secondary | ICD-10-CM | POA: Diagnosis not present

## 2022-10-07 DIAGNOSIS — H409 Unspecified glaucoma: Secondary | ICD-10-CM | POA: Diagnosis not present

## 2022-10-07 DIAGNOSIS — M8580 Other specified disorders of bone density and structure, unspecified site: Secondary | ICD-10-CM | POA: Diagnosis not present

## 2022-10-07 DIAGNOSIS — I8390 Asymptomatic varicose veins of unspecified lower extremity: Secondary | ICD-10-CM | POA: Diagnosis not present

## 2022-10-07 DIAGNOSIS — I129 Hypertensive chronic kidney disease with stage 1 through stage 4 chronic kidney disease, or unspecified chronic kidney disease: Secondary | ICD-10-CM | POA: Diagnosis not present

## 2022-10-07 DIAGNOSIS — E039 Hypothyroidism, unspecified: Secondary | ICD-10-CM | POA: Diagnosis not present

## 2022-10-07 DIAGNOSIS — Z1339 Encounter for screening examination for other mental health and behavioral disorders: Secondary | ICD-10-CM | POA: Diagnosis not present

## 2022-10-07 DIAGNOSIS — M1711 Unilateral primary osteoarthritis, right knee: Secondary | ICD-10-CM | POA: Diagnosis not present

## 2022-10-07 DIAGNOSIS — Z0000 Encounter for general adult medical examination without abnormal findings: Secondary | ICD-10-CM | POA: Diagnosis not present

## 2022-10-07 DIAGNOSIS — E7800 Pure hypercholesterolemia, unspecified: Secondary | ICD-10-CM | POA: Diagnosis not present

## 2022-10-07 DIAGNOSIS — R2689 Other abnormalities of gait and mobility: Secondary | ICD-10-CM | POA: Diagnosis not present

## 2022-10-07 DIAGNOSIS — H259 Unspecified age-related cataract: Secondary | ICD-10-CM | POA: Diagnosis not present

## 2022-10-07 DIAGNOSIS — Z1331 Encounter for screening for depression: Secondary | ICD-10-CM | POA: Diagnosis not present

## 2022-10-21 DIAGNOSIS — H43812 Vitreous degeneration, left eye: Secondary | ICD-10-CM | POA: Diagnosis not present

## 2022-10-21 DIAGNOSIS — H401131 Primary open-angle glaucoma, bilateral, mild stage: Secondary | ICD-10-CM | POA: Diagnosis not present

## 2022-10-21 DIAGNOSIS — Z961 Presence of intraocular lens: Secondary | ICD-10-CM | POA: Diagnosis not present

## 2022-11-18 ENCOUNTER — Ambulatory Visit (INDEPENDENT_AMBULATORY_CARE_PROVIDER_SITE_OTHER): Payer: Medicare Other | Admitting: Podiatry

## 2022-11-18 ENCOUNTER — Encounter: Payer: Self-pay | Admitting: Podiatry

## 2022-11-18 DIAGNOSIS — M79675 Pain in left toe(s): Secondary | ICD-10-CM

## 2022-11-18 DIAGNOSIS — M79674 Pain in right toe(s): Secondary | ICD-10-CM

## 2022-11-18 DIAGNOSIS — B351 Tinea unguium: Secondary | ICD-10-CM

## 2022-11-19 NOTE — Progress Notes (Signed)
Subjective:   Patient ID: Valerie Molina, female   DOB: 88 y.o.   MRN: [id number]   HPI Patient presents with chronic nail disease 1-5 both feet with the right big toenail being very sore and hard to wear shoe gear left   ROS      Objective:  Physical Exam  Neurovascular status unchanged with thick yellow brittle nailbeds 1-5 both feet with the right being especially thickened and painful in the corners     Assessment:  Chronic mycotic nail infection 1-5 both feet with the right one being thicker     Plan:  H&P reviewed anesthetize the right hallux like a debrided down further but I did not have to take out ingrown's today debrided all nails 1-5 both feet no angiogenic bleeding reappoint routine care

## 2023-04-14 DIAGNOSIS — I8390 Asymptomatic varicose veins of unspecified lower extremity: Secondary | ICD-10-CM | POA: Diagnosis not present

## 2023-04-14 DIAGNOSIS — K5900 Constipation, unspecified: Secondary | ICD-10-CM | POA: Diagnosis not present

## 2023-04-14 DIAGNOSIS — R809 Proteinuria, unspecified: Secondary | ICD-10-CM | POA: Diagnosis not present

## 2023-04-14 DIAGNOSIS — M1711 Unilateral primary osteoarthritis, right knee: Secondary | ICD-10-CM | POA: Diagnosis not present

## 2023-04-14 DIAGNOSIS — H259 Unspecified age-related cataract: Secondary | ICD-10-CM | POA: Diagnosis not present

## 2023-04-14 DIAGNOSIS — R918 Other nonspecific abnormal finding of lung field: Secondary | ICD-10-CM | POA: Diagnosis not present

## 2023-04-14 DIAGNOSIS — M8580 Other specified disorders of bone density and structure, unspecified site: Secondary | ICD-10-CM | POA: Diagnosis not present

## 2023-04-14 DIAGNOSIS — I129 Hypertensive chronic kidney disease with stage 1 through stage 4 chronic kidney disease, or unspecified chronic kidney disease: Secondary | ICD-10-CM | POA: Diagnosis not present

## 2023-04-14 DIAGNOSIS — E7800 Pure hypercholesterolemia, unspecified: Secondary | ICD-10-CM | POA: Diagnosis not present

## 2023-04-14 DIAGNOSIS — E039 Hypothyroidism, unspecified: Secondary | ICD-10-CM | POA: Diagnosis not present

## 2023-04-14 DIAGNOSIS — N1832 Chronic kidney disease, stage 3b: Secondary | ICD-10-CM | POA: Diagnosis not present

## 2023-04-14 DIAGNOSIS — R2689 Other abnormalities of gait and mobility: Secondary | ICD-10-CM | POA: Diagnosis not present

## 2023-05-05 DIAGNOSIS — Z961 Presence of intraocular lens: Secondary | ICD-10-CM | POA: Diagnosis not present

## 2023-05-05 DIAGNOSIS — H401131 Primary open-angle glaucoma, bilateral, mild stage: Secondary | ICD-10-CM | POA: Diagnosis not present

## 2023-05-05 DIAGNOSIS — H43812 Vitreous degeneration, left eye: Secondary | ICD-10-CM | POA: Diagnosis not present

## 2023-06-16 DIAGNOSIS — M25561 Pain in right knee: Secondary | ICD-10-CM | POA: Diagnosis not present

## 2023-08-24 DIAGNOSIS — I129 Hypertensive chronic kidney disease with stage 1 through stage 4 chronic kidney disease, or unspecified chronic kidney disease: Secondary | ICD-10-CM | POA: Diagnosis not present

## 2023-08-24 DIAGNOSIS — I872 Venous insufficiency (chronic) (peripheral): Secondary | ICD-10-CM | POA: Diagnosis not present

## 2023-08-24 DIAGNOSIS — N1832 Chronic kidney disease, stage 3b: Secondary | ICD-10-CM | POA: Diagnosis not present

## 2023-08-24 DIAGNOSIS — R609 Edema, unspecified: Secondary | ICD-10-CM | POA: Diagnosis not present

## 2023-08-31 DIAGNOSIS — L720 Epidermal cyst: Secondary | ICD-10-CM | POA: Diagnosis not present

## 2023-08-31 DIAGNOSIS — L814 Other melanin hyperpigmentation: Secondary | ICD-10-CM | POA: Diagnosis not present

## 2023-08-31 DIAGNOSIS — L820 Inflamed seborrheic keratosis: Secondary | ICD-10-CM | POA: Diagnosis not present

## 2023-08-31 DIAGNOSIS — L821 Other seborrheic keratosis: Secondary | ICD-10-CM | POA: Diagnosis not present

## 2023-09-02 DIAGNOSIS — Z23 Encounter for immunization: Secondary | ICD-10-CM | POA: Diagnosis not present

## 2023-09-15 DIAGNOSIS — Z23 Encounter for immunization: Secondary | ICD-10-CM | POA: Diagnosis not present

## 2023-10-06 DIAGNOSIS — M8580 Other specified disorders of bone density and structure, unspecified site: Secondary | ICD-10-CM | POA: Diagnosis not present

## 2023-10-06 DIAGNOSIS — Z0000 Encounter for general adult medical examination without abnormal findings: Secondary | ICD-10-CM | POA: Diagnosis not present

## 2023-10-06 DIAGNOSIS — E039 Hypothyroidism, unspecified: Secondary | ICD-10-CM | POA: Diagnosis not present

## 2023-10-06 DIAGNOSIS — Z1389 Encounter for screening for other disorder: Secondary | ICD-10-CM | POA: Diagnosis not present

## 2023-10-06 DIAGNOSIS — E785 Hyperlipidemia, unspecified: Secondary | ICD-10-CM | POA: Diagnosis not present

## 2023-10-14 DIAGNOSIS — N1832 Chronic kidney disease, stage 3b: Secondary | ICD-10-CM | POA: Diagnosis not present

## 2023-10-14 DIAGNOSIS — Z0000 Encounter for general adult medical examination without abnormal findings: Secondary | ICD-10-CM | POA: Diagnosis not present

## 2023-10-14 DIAGNOSIS — E039 Hypothyroidism, unspecified: Secondary | ICD-10-CM | POA: Diagnosis not present

## 2023-10-14 DIAGNOSIS — M1711 Unilateral primary osteoarthritis, right knee: Secondary | ICD-10-CM | POA: Diagnosis not present

## 2023-10-14 DIAGNOSIS — I700 Atherosclerosis of aorta: Secondary | ICD-10-CM | POA: Diagnosis not present

## 2023-10-14 DIAGNOSIS — R2689 Other abnormalities of gait and mobility: Secondary | ICD-10-CM | POA: Diagnosis not present

## 2023-10-14 DIAGNOSIS — R809 Proteinuria, unspecified: Secondary | ICD-10-CM | POA: Diagnosis not present

## 2023-10-14 DIAGNOSIS — R918 Other nonspecific abnormal finding of lung field: Secondary | ICD-10-CM | POA: Diagnosis not present

## 2023-10-14 DIAGNOSIS — I8390 Asymptomatic varicose veins of unspecified lower extremity: Secondary | ICD-10-CM | POA: Diagnosis not present

## 2023-10-14 DIAGNOSIS — Z1339 Encounter for screening examination for other mental health and behavioral disorders: Secondary | ICD-10-CM | POA: Diagnosis not present

## 2023-10-14 DIAGNOSIS — Z1331 Encounter for screening for depression: Secondary | ICD-10-CM | POA: Diagnosis not present

## 2023-10-14 DIAGNOSIS — M8580 Other specified disorders of bone density and structure, unspecified site: Secondary | ICD-10-CM | POA: Diagnosis not present

## 2023-10-14 DIAGNOSIS — E7800 Pure hypercholesterolemia, unspecified: Secondary | ICD-10-CM | POA: Diagnosis not present

## 2023-10-14 DIAGNOSIS — R82998 Other abnormal findings in urine: Secondary | ICD-10-CM | POA: Diagnosis not present

## 2023-10-14 DIAGNOSIS — I129 Hypertensive chronic kidney disease with stage 1 through stage 4 chronic kidney disease, or unspecified chronic kidney disease: Secondary | ICD-10-CM | POA: Diagnosis not present

## 2023-11-01 DIAGNOSIS — H401131 Primary open-angle glaucoma, bilateral, mild stage: Secondary | ICD-10-CM | POA: Diagnosis not present

## 2023-11-01 DIAGNOSIS — Z961 Presence of intraocular lens: Secondary | ICD-10-CM | POA: Diagnosis not present

## 2023-11-01 DIAGNOSIS — H43812 Vitreous degeneration, left eye: Secondary | ICD-10-CM | POA: Diagnosis not present

## 2023-12-06 DIAGNOSIS — L814 Other melanin hyperpigmentation: Secondary | ICD-10-CM | POA: Diagnosis not present

## 2023-12-06 DIAGNOSIS — D1801 Hemangioma of skin and subcutaneous tissue: Secondary | ICD-10-CM | POA: Diagnosis not present

## 2023-12-06 DIAGNOSIS — L821 Other seborrheic keratosis: Secondary | ICD-10-CM | POA: Diagnosis not present

## 2023-12-06 DIAGNOSIS — L905 Scar conditions and fibrosis of skin: Secondary | ICD-10-CM | POA: Diagnosis not present

## 2024-04-03 DIAGNOSIS — Z961 Presence of intraocular lens: Secondary | ICD-10-CM | POA: Diagnosis not present

## 2024-04-03 DIAGNOSIS — H401131 Primary open-angle glaucoma, bilateral, mild stage: Secondary | ICD-10-CM | POA: Diagnosis not present

## 2024-04-03 DIAGNOSIS — H43812 Vitreous degeneration, left eye: Secondary | ICD-10-CM | POA: Diagnosis not present

## 2024-04-25 DIAGNOSIS — H401131 Primary open-angle glaucoma, bilateral, mild stage: Secondary | ICD-10-CM | POA: Diagnosis not present

## 2024-04-26 DIAGNOSIS — E039 Hypothyroidism, unspecified: Secondary | ICD-10-CM | POA: Diagnosis not present

## 2024-04-26 DIAGNOSIS — I8390 Asymptomatic varicose veins of unspecified lower extremity: Secondary | ICD-10-CM | POA: Diagnosis not present

## 2024-04-26 DIAGNOSIS — I129 Hypertensive chronic kidney disease with stage 1 through stage 4 chronic kidney disease, or unspecified chronic kidney disease: Secondary | ICD-10-CM | POA: Diagnosis not present

## 2024-04-26 DIAGNOSIS — R809 Proteinuria, unspecified: Secondary | ICD-10-CM | POA: Diagnosis not present

## 2024-04-26 DIAGNOSIS — H409 Unspecified glaucoma: Secondary | ICD-10-CM | POA: Diagnosis not present

## 2024-04-26 DIAGNOSIS — N1832 Chronic kidney disease, stage 3b: Secondary | ICD-10-CM | POA: Diagnosis not present

## 2024-04-26 DIAGNOSIS — H259 Unspecified age-related cataract: Secondary | ICD-10-CM | POA: Diagnosis not present

## 2024-04-26 DIAGNOSIS — M8580 Other specified disorders of bone density and structure, unspecified site: Secondary | ICD-10-CM | POA: Diagnosis not present

## 2024-04-26 DIAGNOSIS — R2689 Other abnormalities of gait and mobility: Secondary | ICD-10-CM | POA: Diagnosis not present

## 2024-04-26 DIAGNOSIS — E7800 Pure hypercholesterolemia, unspecified: Secondary | ICD-10-CM | POA: Diagnosis not present

## 2024-04-26 DIAGNOSIS — M1711 Unilateral primary osteoarthritis, right knee: Secondary | ICD-10-CM | POA: Diagnosis not present

## 2024-04-26 DIAGNOSIS — K5900 Constipation, unspecified: Secondary | ICD-10-CM | POA: Diagnosis not present

## 2024-05-08 DIAGNOSIS — H401131 Primary open-angle glaucoma, bilateral, mild stage: Secondary | ICD-10-CM | POA: Diagnosis not present

## 2024-05-08 DIAGNOSIS — Z961 Presence of intraocular lens: Secondary | ICD-10-CM | POA: Diagnosis not present

## 2024-05-08 DIAGNOSIS — H43812 Vitreous degeneration, left eye: Secondary | ICD-10-CM | POA: Diagnosis not present

## 2024-05-25 DIAGNOSIS — H401131 Primary open-angle glaucoma, bilateral, mild stage: Secondary | ICD-10-CM | POA: Diagnosis not present

## 2024-05-25 DIAGNOSIS — Z961 Presence of intraocular lens: Secondary | ICD-10-CM | POA: Diagnosis not present

## 2024-06-12 DIAGNOSIS — M25561 Pain in right knee: Secondary | ICD-10-CM | POA: Diagnosis not present

## 2024-08-10 DIAGNOSIS — I739 Peripheral vascular disease, unspecified: Secondary | ICD-10-CM | POA: Diagnosis not present

## 2024-08-10 DIAGNOSIS — B351 Tinea unguium: Secondary | ICD-10-CM | POA: Diagnosis not present

## 2024-08-10 DIAGNOSIS — M19072 Primary osteoarthritis, left ankle and foot: Secondary | ICD-10-CM | POA: Diagnosis not present

## 2024-08-10 DIAGNOSIS — M79675 Pain in left toe(s): Secondary | ICD-10-CM | POA: Diagnosis not present

## 2024-08-10 DIAGNOSIS — L84 Corns and callosities: Secondary | ICD-10-CM | POA: Diagnosis not present

## 2024-08-28 DIAGNOSIS — Z961 Presence of intraocular lens: Secondary | ICD-10-CM | POA: Diagnosis not present

## 2024-08-28 DIAGNOSIS — H401131 Primary open-angle glaucoma, bilateral, mild stage: Secondary | ICD-10-CM | POA: Diagnosis not present

## 2024-09-04 DIAGNOSIS — Z23 Encounter for immunization: Secondary | ICD-10-CM | POA: Diagnosis not present

## 2024-10-23 DIAGNOSIS — R82998 Other abnormal findings in urine: Secondary | ICD-10-CM | POA: Diagnosis not present
